# Patient Record
Sex: Male | Born: 1955 | Race: White | Hispanic: No | Marital: Married | Smoking: Never smoker
Health system: Southern US, Community
[De-identification: ages and names within clinical notes are randomized; demographics above are authoritative.]

## PROBLEM LIST (undated history)

## (undated) DIAGNOSIS — E119 Type 2 diabetes mellitus without complications: Secondary | ICD-10-CM

## (undated) DIAGNOSIS — E7800 Pure hypercholesterolemia, unspecified: Secondary | ICD-10-CM

## (undated) DIAGNOSIS — E039 Hypothyroidism, unspecified: Secondary | ICD-10-CM

## (undated) DIAGNOSIS — I10 Essential (primary) hypertension: Secondary | ICD-10-CM

## (undated) HISTORY — DX: Type 2 diabetes mellitus without complications: E11.9

## (undated) HISTORY — PX: NECK SURGERY: SHX720

## (undated) HISTORY — DX: Hypothyroidism, unspecified: E03.9

## (undated) HISTORY — PX: HERNIA REPAIR: SHX51

---

## 2000-12-31 ENCOUNTER — Encounter: Payer: Self-pay | Admitting: Unknown Physician Specialty

## 2000-12-31 ENCOUNTER — Ambulatory Visit (HOSPITAL_COMMUNITY): Admission: RE | Admit: 2000-12-31 | Discharge: 2000-12-31 | Payer: Self-pay | Admitting: Unknown Physician Specialty

## 2001-02-04 ENCOUNTER — Observation Stay (HOSPITAL_COMMUNITY): Admission: RE | Admit: 2001-02-04 | Discharge: 2001-02-05 | Payer: Self-pay | Admitting: Neurosurgery

## 2001-03-05 ENCOUNTER — Encounter: Admission: RE | Admit: 2001-03-05 | Discharge: 2001-03-05 | Payer: Self-pay | Admitting: Neurosurgery

## 2001-05-07 ENCOUNTER — Encounter: Admission: RE | Admit: 2001-05-07 | Discharge: 2001-05-07 | Payer: Self-pay | Admitting: Neurosurgery

## 2005-01-25 ENCOUNTER — Ambulatory Visit (HOSPITAL_COMMUNITY): Admission: RE | Admit: 2005-01-25 | Discharge: 2005-01-25 | Payer: Self-pay | Admitting: Family Medicine

## 2008-05-11 ENCOUNTER — Ambulatory Visit (HOSPITAL_COMMUNITY): Admission: RE | Admit: 2008-05-11 | Discharge: 2008-05-11 | Payer: Self-pay | Admitting: Family Medicine

## 2011-03-10 NOTE — H&P (Signed)
[place]. [facility]  Patient:    Eduardo Dunn, Eduardo Dunn                       MRN: [id number] Adm. Date:  02/04/01 Attending:  Cristi Loron, M.D.                         History and Physical  CHIEF COMPLAINT:  Left arm pain.  HISTORY OF PRESENT ILLNESS:  The patient is a 55 year old white male who has had approximately a six-week history of severe neck and left arm pain.  He had failed medical management and has been worked up as an outpatient with a cervical MRI that demonstrated a large herniated disk at C6-7 on the left. The patient therefore weighed the risks, benefits and alternatives to surgery and decided to proceed with an anterior cervical diskectomy and fusion.  PAST MEDICAL HISTORY:  Past medical history is positive for umbilical hernia.  PAST SURGICAL HISTORY:  Umbilical herniorrhaphy, age 22; toe surgery, age 17.  MEDICATIONS PRIOR TO ADMISSION:  P.r.n. Ultram, Naprosyn, Tylenol No. 3.  ALLERGIES:  Patient has no known drug allergies.  FAMILY MEDICAL HISTORY:  The patients mother is age 22, in good health.  The patients father died at age 64 of a myocardial infarction.  SOCIAL HISTORY:  The patient is married.  He has three children.  He lives in [place].  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  He denies tobacco or drug use.  He occasionally drinks alcohol.  REVIEW OF SYSTEMS:  Review of systems is negative except as above.  PHYSICAL EXAMINATION:  GENERAL:  A well-nourished, well-developed, pleasant 55 year old white male complaining of left arm pain.  Height 5 feet 7 inches.  Weight 195 pounds.  HEENT:  Normal.  NECK:  Neck is supple.  There are no masses, deformity, tracheal deviation, jugular venous distention or carotid bruits.  He has a limited cervical range of motion.  Spurlings test is positive on the left and negative on the right. Lhermittes sign was not present.  CHEST:  Thorax is symmetric.  Lungs are clear to  auscultation.  HEART:  Regular rate and rhythm.  ABDOMEN:  Abdomen soft, nontender.  EXTREMITIES:  No obvious deformities.  BACK:  He has no point tenderness or deformities.  NEUROLOGIC:  The patient is alert and oriented x 3.  Cranial nerves II-XII are grossly intact bilaterally.  Vision and hearing are grossly normal bilaterally.  Motor strength is 5/5 in bilateral deltoids, biceps, wrist extensors, interossei, hand grips, psoas, quadriceps, gastrocnemii, extensor hallucis longi and right triceps; his left triceps strength is diminished at 4/5.  Cerebellar exam is intact to rapid alternating movements of the upper extremities bilaterally.  Sensory exam demonstrates decreased light touch sensation in the left C7 distribution, otherwise, unremarkable.  Deep tendon reflexes are 2/4 in the bilateral biceps, brachioradialis, quadriceps, gastrocnemii and right triceps; 1/4 in left triceps.  DIAGNOSTIC STUDY:  The patient had a cervical MRI performed at [facility], December 31, 2000, which demonstrates patient has a straight cervical spine.  At C5-6, he has a central and left-sided bulging.  He has some spondylosis but no significant neural compression.  At C6-7, he has a large left-sided herniated nucleus pulposus compressing the left C7 nerve root. C7-T1 is normal.  ASSESSMENT AND PLAN:  C6-7 herniated nucleus pulposus, degenerative disk disease, spinal stenosis, cervicalgia, cervical radiculopathy.  I discussed the situation  with patient and his wife, reviewed the MRI scan with them and pointed out the abnormalities.  The patients signs and symptoms and his physical exam are consistent with left C7 radiculopathy caused by disk herniation at C6-7.  I discussed the various treatment options including doing nothing, continued medical management and surgery.  I discussed both anterior and posterior surgery.  I described at C6-7 anterior cervical diskectomy, interbody iliac crest  allograft arthrodesis and anterior cervical plating.  I have shown him surgical models and discussed the risks of surgery extensively. The patient has weighed the risks, benefits and alternatives to surgery and wants to proceed with a C6-7 anterior cervical diskectomy, fusion and plating on February 04, 2001. DD:  02/04/01 TD:  02/04/01 Job: [id number]

## 2011-03-10 NOTE — Op Note (Signed)
[place]. [facility]  Patient:    Eduardo Dunn, Eduardo Dunn                          MRN: [id number] Proc. Date: 02/04/01 Adm. Date:  02/04/01 Attending:  Cristi Loron, M.D.                           Operative Report  PREOPERATIVE DIAGNOSIS:  C6-7 herniated nucleus pulposus, degenerative disk disease, spinal stenosis, cervicalgia, cervical radiculopathy.  POSTOPERATIVE DIAGNOSIS:  C6-7 herniated nucleus pulposus, degenerative disk disease, spinal stenosis, cervicalgia, cervical radiculopathy.  PROCEDURES: 1. C6-7 extensive anterior cervical diskectomy. 2. Interbody iliac crest allograft arthrodesis. 3. Anterior cervical plating (Codman titanium plate and screws).  SURGEON:  Cristi Loron, M.D.  ASSISTANT:  Hewitt Shorts, M.D.  ANESTHESIA:  General endotracheal.  ESTIMATED BLOOD LOSS:  100 cc.  SPECIMENS:  None.  DRAINS:  None.  COMPLICATIONS:  None.  BRIEF HISTORY:  The patient is a 55 year old white male who has suffered from neck and severe left arm pain.  He failed medical management and was worked up as an outpatient with a cervical MRI that demonstrated a large herniated disk at C6-7 on the left.  The patients signs and symptoms and physical exam were consistent with a left C7 radiculopathy.  He had failed medical management and therefore weighed the risks, benefits, and alternatives of surgery and decided to proceed with the anterior cervical diskectomy, fusion and plating.  DESCRIPTION OF PROCEDURE:  The patient was brought to the operating room by the anesthesia team.  General endotracheal anesthesia was induced.  The patient remained in a supine position.  A roll was placed under his shoulders to place his neck in slight extension.  His anterior cervical region was then prepared with Betadine scrub and Betadine solution, and sterile drapes were applied.  I then injected the area to be incised with Marcaine with epinephrine  solution.  We used a scalpel to make a transverse incision in the patients left anterior neck.  I used the Metzenbaum scissors to divide the platysma muscle and then dissected medial to the sternocleidomastoid muscle, jugular vein, and carotid artery, and then bluntly dissected down to the anterior cervical spine.  I cleared the soft tissue with Kitner swabs and then inserted a bent spinal needle into the exposed interspace.  I obtained an intraoperative radiograph that demonstrated the needle was at C6-7.  I then used the electrocautery to detach the medial border of the longus colli muscle from the bilateral intervertebral disk space at C6-7.  I inserted a Caspar self-retaining retractor for exposure, and then I used a 15 blade scalpel to incise the C6-7 intervertebral disk.  I performed a partial diskectomy with the pituitary forceps and Karlin curette.  I then inserted distraction screws at C6-7 and distracted the interspace and then used the high-speed drill to decorticate the vertebral end plates at V4-0 and drill away the remainder of the intervertebral disk.  I thinned out the posterior longitudinal ligament with the drill and incised it with the arachnoid knife and removed it with the Kerrison punch, undercutting the vertebral end plates at J8-1, decompressing the thecal sac.  Of note, there was a large left-sided herniated nucleus pulposus compressing the left C7 nerve root.  I removed it in several fragments using the pituitary forceps and then performed a foraminotomy about the bilateral C7 nerve root.  Having  completed the anterior cervical diskectomy, I now turned my attention to the arthrodesis.  I obtained iliac crest tricortical allograft and fashioned it to these approximate dimensions:  7 mm in height, 1 cm in depth. I inserted the graft into the distracted interspace, removed the distraction screws.  There was a good, snug fit of the bone graft.  Having completed  arthrodesis, I now turned my attention to the anterior spinal instrumentation.  I obtained the appropriate-length Codman anterior cervical plate, laying it along the anterior aspect of the vertebral bodies of C6-7, put two holes at C6, two at C7, tapped these holes, and secured the plate to the vertebral bodies with 15 mm screws.  I then obtained the intraoperative radiograph that demonstrated good position of the plate, screws, and interbody graft.  I then secured the screws to the plate with the cam tightener.  I then copiously irrigated the wound with bacitracin solution, removed the solution, and then achieved stringent hemostasis using bipolar electrocautery.  I then inspected the esophagus for any damage, there was none, and then reapproximated the patients platysma muscle with interrupted 3-0 Vicryl sutures, the subcutaneous tissue with interrupted 3-0 Vicryl suture, and the skin with Steri-Strips and benzoin.  The wound was then coated with bacitracin ointment and sterile dressings were applied.  The drapes were removed, and the patient was subsequently extubated by the anesthesia team and transported to the postanesthesia care unit in stable condition.  All sponge, instrument, and needle counts were correct at the end of this case. DD:  02/04/01 TD:  02/05/01 Job: [id number]

## 2012-04-03 ENCOUNTER — Telehealth: Payer: Self-pay

## 2012-04-03 NOTE — Telephone Encounter (Signed)
Called pt. Phone number not working. Called work number. ( Eduardo Dunn's / Eduardo Dunn ) They said he has gone home for the day, I can call another day.

## 2012-04-04 ENCOUNTER — Other Ambulatory Visit: Payer: Self-pay

## 2012-04-04 DIAGNOSIS — Z139 Encounter for screening, unspecified: Secondary | ICD-10-CM

## 2012-04-04 NOTE — Telephone Encounter (Signed)
Gastroenterology Pre-Procedure Form    Request Date: 04/04/2012   Requesting Physician: Dr. Regino Schultze     PATIENT INFORMATION:  Eduardo Dunn is a 56 y.o., male (DOB=Apr 18, 1956).  PROCEDURE: Procedure(s) requested: colonoscopy Procedure Reason: screening for colon cancer  PATIENT REVIEW QUESTIONS: The patient reports the following:   1. Diabetes Melitis: no 2. Joint replacements in the past 12 months: no 3. Major health problems in the past 3 months: no 4. Has an artificial valve or MVP:no 5. Has been advised in past to take antibiotics in advance of a procedure like teeth cleaning: no}    MEDICATIONS & ALLERGIES:    Patient reports the following regarding taking any blood thinners:   Plavix? no Aspirin?no Coumadin?  no  Patient confirms/reports the following medications:  Current Outpatient Prescriptions  Medication Sig Dispense Refill  . lisinopril (PRINIVIL,ZESTRIL) 40 MG tablet Take 40 mg by mouth daily.      . simvastatin (ZOCOR) 20 MG tablet Take 20 mg by mouth every evening.        Patient confirms/reports the following allergies:  No Known Allergies  Patient is appropriate to schedule for requested procedure(s): yes  AUTHORIZATION INFORMATION Primary Insurance:   ID #: Group #:  Pre-Cert / Auth required:  Pre-Cert / Auth #:   Secondary Insurance:   ID #:   Group #:  Pre-Cert / Auth required: Pre-Cert / Auth #:   No orders of the defined types were placed in this encounter.    SCHEDULE INFORMATION: Procedure has been scheduled as follows:  Date: 04/23/2012    Time: 11:00 AM  Location: [facility] short Stay  This Gastroenterology Pre-Precedure Form is being routed to the following provider(s) for review: R. Roetta Sessions, MD

## 2012-04-04 NOTE — Telephone Encounter (Signed)
OK to proceed with colonoscopy.

## 2012-04-04 NOTE — Telephone Encounter (Signed)
Rx and instructions mailed to pt.  

## 2012-04-22 MED ORDER — SODIUM CHLORIDE 0.45 % IV SOLN
Freq: Once | INTRAVENOUS | Status: AC
  Administered 2012-04-23: 10:00:00 via INTRAVENOUS

## 2012-04-23 ENCOUNTER — Ambulatory Visit (HOSPITAL_COMMUNITY)
Admission: RE | Admit: 2012-04-23 | Discharge: 2012-04-23 | Disposition: A | Discharge status: Home / Self Care | Payer: BC Managed Care – PPO | Source: Ambulatory Visit | Attending: Internal Medicine | Admitting: Internal Medicine

## 2012-04-23 ENCOUNTER — Encounter (HOSPITAL_COMMUNITY): Payer: Self-pay | Admitting: *Deleted

## 2012-04-23 ENCOUNTER — Encounter (HOSPITAL_COMMUNITY)
Admission: RE | Disposition: A | Discharge status: Home / Self Care | Payer: Self-pay | Source: Ambulatory Visit | Attending: Internal Medicine

## 2012-04-23 DIAGNOSIS — I10 Essential (primary) hypertension: Secondary | ICD-10-CM | POA: Insufficient documentation

## 2012-04-23 DIAGNOSIS — Z1211 Encounter for screening for malignant neoplasm of colon: Secondary | ICD-10-CM

## 2012-04-23 DIAGNOSIS — D126 Benign neoplasm of colon, unspecified: Secondary | ICD-10-CM | POA: Insufficient documentation

## 2012-04-23 DIAGNOSIS — Z79899 Other long term (current) drug therapy: Secondary | ICD-10-CM | POA: Insufficient documentation

## 2012-04-23 DIAGNOSIS — Z139 Encounter for screening, unspecified: Secondary | ICD-10-CM

## 2012-04-23 DIAGNOSIS — K5730 Diverticulosis of large intestine without perforation or abscess without bleeding: Secondary | ICD-10-CM

## 2012-04-23 DIAGNOSIS — E7800 Pure hypercholesterolemia, unspecified: Secondary | ICD-10-CM | POA: Insufficient documentation

## 2012-04-23 HISTORY — DX: Essential (primary) hypertension: I10

## 2012-04-23 HISTORY — DX: Pure hypercholesterolemia, unspecified: E78.00

## 2012-04-23 HISTORY — PX: COLONOSCOPY: SHX5424

## 2012-04-23 SURGERY — COLONOSCOPY
Anesthesia: Moderate Sedation

## 2012-04-23 MED ORDER — STERILE WATER FOR IRRIGATION IR SOLN
Status: DC | PRN
  Administered 2012-04-23: 11:00:00

## 2012-04-23 MED ORDER — MIDAZOLAM HCL 5 MG/5ML IJ SOLN
INTRAMUSCULAR | Status: DC | PRN
  Administered 2012-04-23: 2 mg via INTRAVENOUS
  Administered 2012-04-23: 1 mg via INTRAVENOUS
  Administered 2012-04-23: 2 mg via INTRAVENOUS

## 2012-04-23 MED ORDER — MEPERIDINE HCL 100 MG/ML IJ SOLN
INTRAMUSCULAR | Status: DC | PRN
  Administered 2012-04-23: 50 mg via INTRAVENOUS
  Administered 2012-04-23 (×2): 25 mg via INTRAVENOUS

## 2012-04-23 MED ORDER — MEPERIDINE HCL 100 MG/ML IJ SOLN
INTRAMUSCULAR | Status: AC
  Filled 2012-04-23: qty 1

## 2012-04-23 MED ORDER — MIDAZOLAM HCL 5 MG/5ML IJ SOLN
INTRAMUSCULAR | Status: AC
  Filled 2012-04-23: qty 10

## 2012-04-23 NOTE — H&P (Signed)
  Primary Care Physician:  Kirk Ruths, MD Primary Gastroenterologist:  Dr. Jena Gauss  Pre-Procedure History & Physical: HPI:  Eduardo Dunn is a 56 y.o. male is here for a screening colonoscopy. No prior colonoscopy. No bowel symptoms. No Family history of colon polyps or colon Dunn.  Past Medical History  Diagnosis Date  . Hypertension   . Hypercholesteremia     Past Surgical History  Procedure Date  . Hernia repair     at age 79  . Neck surgery     Prior to Admission medications   Medication Sig Start Date End Date Taking? Authorizing Provider  lisinopril (PRINIVIL,ZESTRIL) 40 MG tablet Take 40 mg by mouth daily.   Yes Historical Provider, MD  simvastatin (ZOCOR) 20 MG tablet Take 20 mg by mouth every evening.    Historical Provider, MD    Allergies as of 04/04/2012  . (No Known Allergies)    Family History  Problem Relation Age of Onset  . Colon Dunn Neg Hx     History   Social History  . Marital Status: Married    Spouse Name: N/A    Number of Children: N/A  . Years of Education: N/A   Occupational History  . Not on file.   Social History Main Topics  . Smoking status: Never Smoker   . Smokeless tobacco: Not on file  . Alcohol Use: No  . Drug Use: No  . Sexually Active:    Other Topics Concern  . Not on file   Social History Narrative  . No narrative on file    Review of Systems: See HPI, otherwise negative ROS  Physical Exam: BP 140/90  Pulse 68  Temp 98.7 F (37.1 C) (Oral)  Resp 18  Ht 5' 7.5" (1.715 m)  Wt 208 lb (94.348 kg)  BMI 32.10 kg/m2  SpO2 96% General:   Alert,  Well-developed, well-nourished, pleasant and cooperative in NAD Head:  Normocephalic and atraumatic. Eyes:  Sclera clear, no icterus.   Conjunctiva pink. Ears:  Normal auditory acuity. Nose:  No deformity, discharge,  or lesions. Mouth:  No deformity or lesions, dentition normal. Neck:  Supple; no masses or thyromegaly. Lungs:  Clear throughout to  auscultation.   No wheezes, crackles, or rhonchi. No acute distress. Heart:  Regular rate and rhythm; no murmurs, clicks, rubs,  or gallops. Abdomen:  Soft, nontender and nondistended. No masses, hepatosplenomegaly or hernias noted. Normal bowel sounds, without guarding, and without rebound.   Msk:  Symmetrical without gross deformities. Normal posture. Pulses:  Normal pulses noted. Extremities:  Without clubbing or edema. Neurologic:  Alert and  oriented x4;  grossly normal neurologically. Skin:  Intact without significant lesions or rashes. Cervical Nodes:  No significant cervical adenopathy. Psych:  Alert and cooperative. Normal mood and affect.  Impression/Plan: Eduardo Dunn is now here to undergo a screening colonoscopy. First-ever average risk screening examination.  Risks, benefits, limitations, imponderables and alternatives regarding colonoscopy have been reviewed with the patient. Questions have been answered. All parties agreeable.

## 2012-04-23 NOTE — Discharge Instructions (Signed)
Colonoscopy Discharge Instructions  Read the instructions outlined below and refer to this sheet in the next few weeks. These discharge instructions provide you with general information on caring for yourself after you leave the hospital. Your doctor may also give you specific instructions. While your treatment has been planned according to the most current medical practices available, unavoidable complications occasionally occur. If you have any problems or questions after discharge, call Dr. Jena Gauss at [phone]. ACTIVITY  You may resume your regular activity, but move at a slower pace for the next 24 hours.   Take frequent rest periods for the next 24 hours.   Walking will help get rid of the air and reduce the bloated feeling in your belly (abdomen).   No driving for 24 hours (because of the medicine (anesthesia) used during the test).    Do not sign any important legal documents or operate any machinery for 24 hours (because of the anesthesia used during the test).  NUTRITION  Drink plenty of fluids.   You may resume your normal diet as instructed by your doctor.   Begin with a light meal and progress to your normal diet. Heavy or fried foods are harder to digest and may make you feel sick to your stomach (nauseated).   Avoid alcoholic beverages for 24 hours or as instructed.  MEDICATIONS  You may resume your normal medications unless your doctor tells you otherwise.  WHAT YOU CAN EXPECT TODAY  Some feelings of bloating in the abdomen.   Passage of more gas than usual.   Spotting of blood in your stool or on the toilet paper.  IF YOU HAD POLYPS REMOVED DURING THE COLONOSCOPY:  No aspirin products for 7 days or as instructed.   No alcohol for 7 days or as instructed.   Eat a soft diet for the next 24 hours.  FINDING OUT THE RESULTS OF YOUR TEST Not all test results are available during your visit. If your test results are not back during the visit, make an appointment  with your caregiver to find out the results. Do not assume everything is normal if you have not heard from your caregiver or the medical facility. It is important for you to follow up on all of your test results.  SEEK IMMEDIATE MEDICAL ATTENTION IF:  You have more than a spotting of blood in your stool.   Your belly is swollen (abdominal distention).   You are nauseated or vomiting.   You have a temperature over 101.   You have abdominal pain or discomfort that is severe or gets worse throughout the day.   Diverticulosis and polyp information provided.  Further recommendations to follow pending review of pathology report   Colon Polyps A polyp is extra tissue that grows inside your body. Colon polyps grow in the large intestine. The large intestine, also called the colon, is part of your digestive system. It is a long, hollow tube at the end of your digestive tract where your body makes and stores stool. Most polyps are not dangerous. They are benign. This means they are not cancerous. But over time, some types of polyps can turn into cancer. Polyps that are smaller than a pea are usually not harmful. But larger polyps could someday become or may already be cancerous. To be safe, doctors remove all polyps and test them.  WHO GETS POLYPS? Anyone can get polyps, but certain people are more likely than others. You may have a greater chance of getting polyps  if:  You are over 50.   You have had polyps before.   Someone in your family has had polyps.   Someone in your family has had cancer of the large intestine.   Find out if someone in your family has had polyps. You may also be more likely to get polyps if you:   Eat a lot of fatty foods.   Smoke.   Drink alcohol.   Do not exercise.   Eat too much.  SYMPTOMS  Most small polyps do not cause symptoms. People often do not know they have one until their caregiver finds it during a regular checkup or while testing them for  something else. Some people do have symptoms like these:  Bleeding from the anus. You might notice blood on your underwear or on toilet paper after you have had a bowel movement.   Constipation or diarrhea that lasts more than a week.   Blood in the stool. Blood can make stool look black or it can show up as red streaks in the stool.  If you have any of these symptoms, see your caregiver. HOW DOES THE DOCTOR TEST FOR POLYPS? The doctor can use four tests to check for polyps:  Digital rectal exam. The caregiver wears gloves and checks your rectum (the last part of the large intestine) to see if it feels normal. This test would find polyps only in the rectum. Your caregiver may need to do one of the other tests listed below to find polyps higher up in the intestine.   Barium enema. The caregiver puts a liquid called barium into your rectum before taking x-rays of your large intestine. Barium makes your intestine look white in the pictures. Polyps are dark, so they are easy to see.   Sigmoidoscopy. With this test, the caregiver can see inside your large intestine. A thin flexible tube is placed into your rectum. The device is called a sigmoidoscope, which has a light and a tiny video camera in it. The caregiver uses the sigmoidoscope to look at the last third of your large intestine.   Colonoscopy. This test is like sigmoidoscopy, but the caregiver looks at all of the large intestine. It usually requires sedation. This is the most common method for finding and removing polyps.  TREATMENT   The caregiver will remove the polyp during sigmoidoscopy or colonoscopy. The polyp is then tested for cancer.   If you have had polyps, your caregiver may want you to get tested regularly in the future.  PREVENTION  There is not one sure way to prevent polyps. You might be able to lower your risk of getting them if you:  Eat more fruits and vegetables and less fatty food.   Do not smoke.   Avoid  alcohol.   Exercise every day.   Lose weight if you are overweight.   Eating more calcium and folate can also lower your risk of getting polyps. Some foods that are rich in calcium are milk, cheese, and broccoli. Some foods that are rich in folate are chickpeas, kidney beans, and spinach.   Aspirin might help prevent polyps. Studies are under way.  Document Released: 07/05/2004 Document Revised: 09/28/2011 Document Reviewed: 12/11/2007 [facility] Patient Information 2012 [place], [place].Diverticulosis Diverticulosis is a common condition that develops when small pouches (diverticula) form in the wall of the colon. The risk of diverticulosis increases with age. It happens more often in people who eat a low-fiber diet. Most individuals with diverticulosis have no symptoms.  Those individuals with symptoms usually experience abdominal pain, constipation, or loose stools (diarrhea). HOME CARE INSTRUCTIONS   Increase the amount of fiber in your diet as directed by your caregiver or dietician. This may reduce symptoms of diverticulosis.   Your caregiver may recommend taking a dietary fiber supplement.   Drink at least 6 to 8 glasses of water each day to prevent constipation.   Try not to strain when you have a bowel movement.   Your caregiver may recommend avoiding nuts and seeds to prevent complications, although this is still an uncertain benefit.   Only take over-the-counter or prescription medicines for pain, discomfort, or fever as directed by your caregiver.  FOODS WITH HIGH FIBER CONTENT INCLUDE:  Fruits. Apple, peach, pear, tangerine, raisins, prunes.   Vegetables. Brussels sprouts, asparagus, broccoli, cabbage, carrot, cauliflower, romaine lettuce, spinach, summer squash, tomato, winter squash, zucchini.   Starchy Vegetables. Baked beans, kidney beans, lima beans, split peas, lentils, potatoes (with skin).   Grains. Whole wheat bread, brown rice, bran flake cereal, plain oatmeal,  white rice, shredded wheat, bran muffins.  SEEK IMMEDIATE MEDICAL CARE IF:   You develop increasing pain or severe bloating.   You have an oral temperature above 102 F (38.9 C), not controlled by medicine.   You develop vomiting or bowel movements that are bloody or black.  Document Released: 07/06/2004 Document Revised: 09/28/2011 Document Reviewed: 03/09/2010 [facility] Patient Information 2012 [place], [place].

## 2012-04-23 NOTE — Op Note (Signed)
[facility] [address] [place], [place]  [address]  COLONOSCOPY PROCEDURE REPORT  PATIENT:  Eduardo Dunn, Eduardo Dunn  MR#:  [id number] BIRTHDATE:  1955/12/17, 55 yrs. old  GENDER:  male ENDOSCOPIST:  R. Roetta Sessions, MD FACP [facility] REF. BY:  Karleen Hampshire, M.D. PROCEDURE DATE:  04/23/2012 PROCEDURE:  Colonoscopy with biopsy INDICATIONS:  First-ever average risk screening colonoscopy  INFORMED CONSENT:  The risks, benefits, alternatives and imponderables including but not limited to bleeding, perforation as well as the possibility of a missed lesion have been reviewed. The potential for biopsy, lesion removal, etc. have also been discussed.  Questions have been answered.  All parties agreeable. Please see the history and physical in the medical record for more information.  MEDICATIONS:  Versed 5 mg IV 100 mg IV in divided doses.  DESCRIPTION OF PROCEDURE:  After a digital rectal exam was performed, the EC-[id number] ([id number]) colonoscope was advanced from the anus through the rectum and colon to the area of the cecum, ileocecal valve and appendiceal orifice.  The cecum was deeply intubated.  These structures were well-seen and photographed for the record.  From the level of the cecum and ileocecal valve, the scope was slowly and cautiously withdrawn.  The mucosal surfaces were carefully surveyed utilizing scope tip deflection to facilitate fold flattening as needed.  The scope was pulled down into the rectum where a thorough examination including retroflexion was performed. <<PROCEDUREIMAGES>>  FINDINGS: Suboptimal preparation. Normal rectum. Scattered small mouth pancolonic diverticula; a single descending colon polyp was seen; the remainder of the colonic mucosa appeared normal. The distal 10 cm of terminal ileal mucosa appeared normal.  THERAPEUTIC / DIAGNOSTIC MANEUVERS PERFORMED:  The descending colon polyp mentioned above was cold biopsied/removed  COMPLICATIONS:   None  CECAL WITHDRAWAL TIME:    10 minutes  IMPRESSION: Colonic diverticulosis. Colonic polyps removed as described above  RECOMMENDATIONS:  Followup on pathology.  ______________________________ R. Roetta Sessions, MD Caleen Essex  CC:  Karleen Hampshire, M.D.  n. eSIGNED:   R. Roetta Sessions at 04/23/2012 11:30 AM  Norva Riffle, [phone]

## 2012-04-27 ENCOUNTER — Encounter: Payer: Self-pay | Admitting: Internal Medicine

## 2012-04-29 ENCOUNTER — Encounter (HOSPITAL_COMMUNITY): Payer: Self-pay | Admitting: Internal Medicine

## 2017-03-29 ENCOUNTER — Other Ambulatory Visit (HOSPITAL_COMMUNITY): Payer: Self-pay | Admitting: Family Medicine

## 2017-03-29 DIAGNOSIS — R1032 Left lower quadrant pain: Secondary | ICD-10-CM

## 2017-04-13 ENCOUNTER — Ambulatory Visit (HOSPITAL_COMMUNITY)
Admission: RE | Admit: 2017-04-13 | Discharge: 2017-04-13 | Disposition: A | Discharge status: Home / Self Care | Payer: BLUE CROSS/BLUE SHIELD | Source: Ambulatory Visit | Attending: Family Medicine | Admitting: Family Medicine

## 2017-04-13 ENCOUNTER — Encounter (HOSPITAL_COMMUNITY): Payer: Self-pay

## 2019-07-02 ENCOUNTER — Other Ambulatory Visit: Payer: Self-pay | Admitting: Family Medicine

## 2019-07-02 ENCOUNTER — Other Ambulatory Visit (HOSPITAL_COMMUNITY): Payer: Self-pay | Admitting: Family Medicine

## 2019-07-02 DIAGNOSIS — R1031 Right lower quadrant pain: Secondary | ICD-10-CM

## 2019-07-02 DIAGNOSIS — R1032 Left lower quadrant pain: Secondary | ICD-10-CM

## 2019-07-15 ENCOUNTER — Other Ambulatory Visit (HOSPITAL_COMMUNITY): Payer: BLUE CROSS/BLUE SHIELD

## 2019-07-15 ENCOUNTER — Encounter (HOSPITAL_COMMUNITY): Payer: Self-pay

## 2019-10-04 ENCOUNTER — Emergency Department (INDEPENDENT_AMBULATORY_CARE_PROVIDER_SITE_OTHER)
Admission: EM | Admit: 2019-10-04 | Discharge: 2019-10-04 | Disposition: A | Discharge status: Home / Self Care | Payer: BLUE CROSS/BLUE SHIELD | Source: Home / Self Care

## 2019-10-04 ENCOUNTER — Other Ambulatory Visit: Payer: Self-pay

## 2019-10-04 DIAGNOSIS — R509 Fever, unspecified: Secondary | ICD-10-CM

## 2019-10-04 DIAGNOSIS — U071 COVID-19: Secondary | ICD-10-CM | POA: Diagnosis not present

## 2019-10-04 LAB — POC SARS CORONAVIRUS 2 AG -  ED: SARS Coronavirus 2 Ag: POSITIVE — AB

## 2019-10-04 NOTE — ED Triage Notes (Signed)
Here with temp 101.0 last night relieved by OTC Tylenol. Denies contact COVID exposure Wife asymptomatic

## 2019-10-05 NOTE — ED Provider Notes (Signed)
[facility]    CSN: [id number] Arrival date & time: 10/04/19  1058      History   Chief Complaint Chief Complaint  Patient presents with  . Fever    HPI [name] is a 63 y.o. male.   Patient reports he had a fever to 101 last night.  He is here requesting Covid testing.  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  The history is provided by the patient. No language interpreter was used.  Fever Ineffective treatments:  Acetaminophen Associated symptoms: chills     Past Medical History:  Diagnosis Date  . Hypercholesteremia   . Hypertension     There are no problems to display for this patient.   Past Surgical History:  Procedure Laterality Date  . COLONOSCOPY  04/23/2012   Procedure: COLONOSCOPY;  Surgeon: Daneil Dolin, MD;  Location: [facility];  Service: Endoscopy;  Laterality: N/A;  11:00 AM  . HERNIA REPAIR     at age 21  . NECK SURGERY         Home Medications    Prior to Admission medications   Medication Sig Start Date End Date Taking? Authorizing Provider  lisinopril (PRINIVIL,ZESTRIL) 40 MG tablet Take 40 mg by mouth daily.    [provider]  simvastatin (ZOCOR) 20 MG tablet Take 20 mg by mouth every evening.    [provider]    Family History Family History  Problem Relation Age of Onset  . Colon cancer Neg Hx     Social History Social History   Tobacco Use  . Smoking status: Never Smoker  Substance Use Topics  . Alcohol use: No  . Drug use: No     Allergies   Patient has no known allergies.   Review of Systems Review of Systems  Constitutional: Positive for chills and fever.  All other systems reviewed and are negative.    Physical Exam Triage Vital Signs ED Triage Vitals  Enc Vitals Group     BP 10/04/19 1152 (!) 141/81     Pulse Rate 10/04/19 1152 80     Resp --      Temp 10/04/19 1152 99.2 F (37.3 C)     Temp Source 10/04/19 1152 Oral   SpO2 10/04/19 1152 98 %     Weight 10/04/19 1153 239 lb (108.4 kg)     Height 10/04/19 1153 5\' 7"  (1.702 m)     Head Circumference --      Peak Flow --      Pain Score 10/04/19 1153 0     Pain Loc --      Pain Edu? --      Excl. in [place]? --    No data found.  Updated Vital Signs BP (!) 141/81 (BP Location: Right Arm)   Pulse 80   Temp 99.2 F (37.3 C) (Oral)   Ht 5\' 7"  (1.702 m)   Wt 108.4 kg   SpO2 98%   BMI 37.43 kg/m   Visual Acuity Right Eye Distance:   Left Eye Distance:   Bilateral Distance:    Right Eye Near:   Left Eye Near:    Bilateral Near:     Physical Exam Vitals and nursing note reviewed.  Constitutional:      Appearance: He is well-developed.  HENT:     Head: Normocephalic and atraumatic.  Eyes:     Conjunctiva/sclera: Conjunctivae normal.  Cardiovascular:  Rate and Rhythm: Normal rate and regular rhythm.     Heart sounds: No murmur.  Pulmonary:     Effort: Pulmonary effort is normal. No respiratory distress.     Breath sounds: Normal breath sounds.  Abdominal:     Tenderness: There is no abdominal tenderness.  Musculoskeletal:     Cervical back: Neck supple.  Skin:    General: Skin is warm and dry.  Neurological:     General: No focal deficit present.     Mental Status: He is alert.  Psychiatric:        Mood and Affect: Mood normal.      UC Treatments / Results  Labs (all labs ordered are listed, but only abnormal results are displayed) Labs Reviewed  POC SARS CORONAVIRUS 2 AG -  ED - Abnormal; Notable for the following components:      Result Value   SARS Coronavirus 2 Ag Positive (*)    All other components within normal limits    EKG   Radiology No results found.  Procedures Procedures (including critical care time)  Medications Ordered in UC Medications - No data to display  Initial Impression / Assessment and Plan / UC Course  I have reviewed the triage vital signs and the nursing notes.  Pertinent labs &  imaging results that were available during my care of the patient were reviewed by me and considered in my medical decision making (see chart for details).     MDM: Patient's Covid test is positive patient is counseled on quarantine he is also advised he should notify the people that he was around. Final Clinical Impressions(s) / UC Diagnoses   Final diagnoses:  [id number] virus detected   Discharge Instructions   None    ED Prescriptions    None     PDMP not reviewed this encounter.  An After Visit Summary was printed and given to the patient.   Fransico Meadow, [place] 10/05/19 [phone]

## 2019-10-13 ENCOUNTER — Other Ambulatory Visit: Payer: Self-pay

## 2019-10-13 ENCOUNTER — Inpatient Hospital Stay (HOSPITAL_COMMUNITY)
Admission: EM | Admit: 2019-10-13 | Discharge: 2019-10-21 | DRG: 177 | Disposition: A | Discharge status: Home / Self Care | Payer: BLUE CROSS/BLUE SHIELD | Attending: Internal Medicine | Admitting: Internal Medicine

## 2019-10-13 ENCOUNTER — Emergency Department (HOSPITAL_COMMUNITY): Payer: BLUE CROSS/BLUE SHIELD

## 2019-10-13 ENCOUNTER — Encounter (HOSPITAL_COMMUNITY): Payer: Self-pay

## 2019-10-13 DIAGNOSIS — J1289 Other viral pneumonia: Secondary | ICD-10-CM | POA: Diagnosis present

## 2019-10-13 DIAGNOSIS — J069 Acute upper respiratory infection, unspecified: Secondary | ICD-10-CM | POA: Diagnosis not present

## 2019-10-13 DIAGNOSIS — U071 COVID-19: Secondary | ICD-10-CM | POA: Diagnosis present

## 2019-10-13 DIAGNOSIS — E118 Type 2 diabetes mellitus with unspecified complications: Secondary | ICD-10-CM | POA: Diagnosis present

## 2019-10-13 DIAGNOSIS — Z79899 Other long term (current) drug therapy: Secondary | ICD-10-CM | POA: Diagnosis not present

## 2019-10-13 DIAGNOSIS — I10 Essential (primary) hypertension: Secondary | ICD-10-CM | POA: Diagnosis present

## 2019-10-13 DIAGNOSIS — Z23 Encounter for immunization: Secondary | ICD-10-CM | POA: Diagnosis not present

## 2019-10-13 DIAGNOSIS — J1282 Pneumonia due to coronavirus disease 2019: Secondary | ICD-10-CM | POA: Diagnosis present

## 2019-10-13 DIAGNOSIS — E1169 Type 2 diabetes mellitus with other specified complication: Secondary | ICD-10-CM | POA: Diagnosis present

## 2019-10-13 DIAGNOSIS — R7989 Other specified abnormal findings of blood chemistry: Secondary | ICD-10-CM | POA: Diagnosis not present

## 2019-10-13 DIAGNOSIS — R0602 Shortness of breath: Secondary | ICD-10-CM | POA: Diagnosis present

## 2019-10-13 DIAGNOSIS — E785 Hyperlipidemia, unspecified: Secondary | ICD-10-CM | POA: Diagnosis present

## 2019-10-13 DIAGNOSIS — R748 Abnormal levels of other serum enzymes: Secondary | ICD-10-CM | POA: Diagnosis present

## 2019-10-13 DIAGNOSIS — E7800 Pure hypercholesterolemia, unspecified: Secondary | ICD-10-CM | POA: Diagnosis present

## 2019-10-13 DIAGNOSIS — R0902 Hypoxemia: Secondary | ICD-10-CM

## 2019-10-13 DIAGNOSIS — J9601 Acute respiratory failure with hypoxia: Secondary | ICD-10-CM | POA: Diagnosis present

## 2019-10-13 DIAGNOSIS — E876 Hypokalemia: Secondary | ICD-10-CM | POA: Diagnosis present

## 2019-10-13 LAB — COMPREHENSIVE METABOLIC PANEL
ALT: 26 U/L (ref 0–44)
AST: 33 U/L (ref 15–41)
Albumin: 2.8 g/dL — ABNORMAL LOW (ref 3.5–5.0)
Alkaline Phosphatase: 68 U/L (ref 38–126)
Anion gap: 14 (ref 5–15)
BUN: 23 mg/dL (ref 8–23)
CO2: 21 mmol/L — ABNORMAL LOW (ref 22–32)
Calcium: 7.7 mg/dL — ABNORMAL LOW (ref 8.9–10.3)
Chloride: 100 mmol/L (ref 98–111)
Creatinine, Ser: 0.81 mg/dL (ref 0.61–1.24)
GFR calc Af Amer: >60 mL/min (ref >60)
GFR calc non Af Amer: >60 mL/min (ref >60)
Glucose, Bld: 137 mg/dL — ABNORMAL HIGH (ref 70–99)
Potassium: 3.2 mmol/L — ABNORMAL LOW (ref 3.5–5.1)
Sodium: 135 mmol/L (ref 135–145)
Total Bilirubin: 0.7 mg/dL (ref 0.3–1.2)
Total Protein: 6.9 g/dL (ref 6.5–8.1)

## 2019-10-13 LAB — FERRITIN: Ferritin: 339 ng/mL — ABNORMAL HIGH (ref 24–336)

## 2019-10-13 LAB — GLUCOSE, CAPILLARY
Glucose-Capillary: 227 mg/dL — ABNORMAL HIGH (ref 70–99)
Glucose-Capillary: 153 mg/dL — ABNORMAL HIGH (ref 70–99)

## 2019-10-13 LAB — CBC WITH DIFFERENTIAL/PLATELET
Abs Immature Granulocytes: 0.52 10*3/uL — ABNORMAL HIGH (ref 0.00–0.07)
Basophils Absolute: 0.1 10*3/uL (ref 0.0–0.1)
Basophils Relative: 0 %
Eosinophils Absolute: 0 10*3/uL (ref 0.0–0.5)
Eosinophils Relative: 0 %
HCT: 44.3 % (ref 39.0–52.0)
Hemoglobin: 14.3 g/dL (ref 13.0–17.0)
Immature Granulocytes: 3 %
Lymphocytes Relative: 11 %
Lymphs Abs: 1.9 10*3/uL (ref 0.7–4.0)
MCH: 29.7 pg (ref 26.0–34.0)
MCHC: 32.3 g/dL (ref 30.0–36.0)
MCV: 92.1 fL (ref 80.0–100.0)
Monocytes Absolute: 0.7 10*3/uL (ref 0.1–1.0)
Monocytes Relative: 4 %
Neutro Abs: 14.1 10*3/uL — ABNORMAL HIGH (ref 1.7–7.7)
Neutrophils Relative %: 82 %
Platelets: 425 10*3/uL — ABNORMAL HIGH (ref 150–400)
RBC: 4.81 MIL/uL (ref 4.22–5.81)
RDW: 13.5 % (ref 11.5–15.5)
WBC: 17.2 10*3/uL — ABNORMAL HIGH (ref 4.0–10.5)
nRBC: 0.1 % (ref 0.0–0.2)

## 2019-10-13 LAB — LACTIC ACID, PLASMA
Lactic Acid, Venous: 2.1 mmol/L (ref 0.5–1.9)
Lactic Acid, Venous: 1.3 mmol/L (ref 0.5–1.9)

## 2019-10-13 LAB — D-DIMER, QUANTITATIVE: D-Dimer, Quant: 1.56 ug/mL-FEU — ABNORMAL HIGH (ref 0.00–0.50)

## 2019-10-13 LAB — C-REACTIVE PROTEIN: CRP: 8.1 mg/dL — ABNORMAL HIGH (ref <1.0)

## 2019-10-13 LAB — HEMOGLOBIN A1C
Hgb A1c MFr Bld: 6.7 % — ABNORMAL HIGH (ref 4.8–5.6)
Mean Plasma Glucose: 145.59 mg/dL

## 2019-10-13 LAB — LACTATE DEHYDROGENASE: LDH: 367 U/L — ABNORMAL HIGH (ref 98–192)

## 2019-10-13 LAB — FIBRINOGEN: Fibrinogen: >800 mg/dL — ABNORMAL HIGH (ref 210–475)

## 2019-10-13 LAB — TRIGLYCERIDES: Triglycerides: 185 mg/dL — ABNORMAL HIGH (ref <150)

## 2019-10-13 LAB — PROCALCITONIN: Procalcitonin: <0.1 ng/mL

## 2019-10-13 LAB — MAGNESIUM: Magnesium: 2.5 mg/dL — ABNORMAL HIGH (ref 1.7–2.4)

## 2019-10-13 MED ORDER — INSULIN ASPART 100 UNIT/ML ~~LOC~~ SOLN: 0.0000 [IU] | Freq: Every day | SUBCUTANEOUS | Status: DC

## 2019-10-13 MED ORDER — PANTOPRAZOLE SODIUM 40 MG PO TBEC
40.0000 mg | DELAYED_RELEASE_TABLET | Freq: Every day | ORAL | Status: DC
  Administered 2019-10-13 – 2019-10-21 (×9): 40 mg via ORAL
  Filled 2019-10-13 (×10): qty 1

## 2019-10-13 MED ORDER — INSULIN DETEMIR 100 UNIT/ML ~~LOC~~ SOLN
0.0750 [IU]/kg | Freq: Two times a day (BID) | SUBCUTANEOUS | Status: DC
  Administered 2019-10-13 – 2019-10-20 (×15): 8 [IU] via SUBCUTANEOUS
  Filled 2019-10-13 (×17): qty 0.08

## 2019-10-13 MED ORDER — POTASSIUM CHLORIDE CRYS ER 20 MEQ PO TBCR
40.0000 meq | EXTENDED_RELEASE_TABLET | Freq: Once | ORAL | Status: AC
  Administered 2019-10-13: 40 meq via ORAL
  Filled 2019-10-13: qty 2

## 2019-10-13 MED ORDER — ENOXAPARIN SODIUM 40 MG/0.4ML ~~LOC~~ SOLN
40.0000 mg | Freq: Two times a day (BID) | SUBCUTANEOUS | Status: DC
  Administered 2019-10-13 – 2019-10-15 (×4): 40 mg via SUBCUTANEOUS
  Filled 2019-10-13 (×5): qty 0.4

## 2019-10-13 MED ORDER — SODIUM CHLORIDE 0.9 % IV SOLN
200.0000 mg | Freq: Once | INTRAVENOUS | Status: AC
  Administered 2019-10-13: 12:00:00 200 mg via INTRAVENOUS
  Filled 2019-10-13: qty 40

## 2019-10-13 MED ORDER — INSULIN ASPART 100 UNIT/ML ~~LOC~~ SOLN
0.0000 [IU] | Freq: Three times a day (TID) | SUBCUTANEOUS | Status: DC
  Administered 2019-10-13: 19:00:00 3 [IU] via SUBCUTANEOUS
  Administered 2019-10-14: 2 [IU] via SUBCUTANEOUS
  Administered 2019-10-14 – 2019-10-15 (×3): 1 [IU] via SUBCUTANEOUS
  Administered 2019-10-15 (×2): 2 [IU] via SUBCUTANEOUS
  Administered 2019-10-16: 3 [IU] via SUBCUTANEOUS
  Administered 2019-10-16: 2 [IU] via SUBCUTANEOUS
  Administered 2019-10-16 – 2019-10-17 (×3): 1 [IU] via SUBCUTANEOUS
  Administered 2019-10-17 – 2019-10-18 (×2): 3 [IU] via SUBCUTANEOUS
  Administered 2019-10-18 – 2019-10-19 (×3): 2 [IU] via SUBCUTANEOUS
  Administered 2019-10-20: 18:00:00 3 [IU] via SUBCUTANEOUS

## 2019-10-13 MED ORDER — METHYLPREDNISOLONE SODIUM SUCC 125 MG IJ SOLR
50.0000 mg | Freq: Two times a day (BID) | INTRAMUSCULAR | Status: DC
  Administered 2019-10-13 – 2019-10-20 (×14): 50 mg via INTRAVENOUS
  Filled 2019-10-13 (×14): qty 2

## 2019-10-13 MED ORDER — SODIUM CHLORIDE 0.9 % IV SOLN
100.0000 mg | Freq: Every day | INTRAVENOUS | Status: DC
  Administered 2019-10-14 – 2019-10-15 (×2): 100 mg via INTRAVENOUS
  Filled 2019-10-13 (×3): qty 20

## 2019-10-13 MED ORDER — TOCILIZUMAB 400 MG/20ML IV SOLN
800.0000 mg | Freq: Once | INTRAVENOUS | Status: DC
  Filled 2019-10-13: qty 40

## 2019-10-13 MED ORDER — TOCILIZUMAB 400 MG/20ML IV SOLN
800.0000 mg | Freq: Once | INTRAVENOUS | Status: AC
  Administered 2019-10-13: 23:00:00 800 mg via INTRAVENOUS
  Filled 2019-10-13: qty 40

## 2019-10-13 MED ORDER — DEXAMETHASONE SODIUM PHOSPHATE 10 MG/ML IJ SOLN
10.0000 mg | Freq: Once | INTRAMUSCULAR | Status: AC
  Administered 2019-10-13: 10 mg via INTRAVENOUS
  Filled 2019-10-13: qty 1

## 2019-10-13 MED ORDER — POTASSIUM CHLORIDE CRYS ER 20 MEQ PO TBCR
20.0000 meq | EXTENDED_RELEASE_TABLET | Freq: Once | ORAL | Status: AC
  Administered 2019-10-13: 12:00:00 20 meq via ORAL
  Filled 2019-10-13: qty 1

## 2019-10-13 NOTE — Progress Notes (Addendum)
Seen on arrival to [facility] from [facility]. He appears comfortable on nonrebreather.  Notes breath at the moment feels "ok".  Vitals:   10/13/19 1415 10/13/19 1430  BP:  122/76  Pulse: 74 77  Resp: (!) 30 (!) 30  Temp:    SpO2: 93% (!) 88%   COVID-19 Labs  Recent Labs    10/13/19 0911  DDIMER 1.56*  FERRITIN 339*  LDH 367*  CRP 8.1*    No results found for: SARSCOV2NAA  Continue steroids and remedsivir. Intermediate dose lovenox given critical illness. Actemra ordered at [facility].  Not given yet.  Give when able.  Dr. Carles Collet discussed risks/benefits and I also reviewed risks/benefits, lack of clear evidence, off label use.  Denies hx TB or hepatitis. Discussed plasma risks/benefits.  He's going to review consent form and let me know if he has additional questions.  Will give plasma if he's agreeable as well.  Addendum: pt seems like he'd like to hold off on plasma for now.   May need heated high flow depending on course, will follow closely.

## 2019-10-13 NOTE — Plan of Care (Signed)
  Problem: Education: Goal: Knowledge of risk factors and measures for prevention of condition will improve 10/13/2019 2046 by Raelyn Ensign, RN Outcome: Progressing 10/13/2019 2046 by Raelyn Ensign, RN Outcome: Progressing

## 2019-10-13 NOTE — Plan of Care (Signed)
  Problem: Respiratory: Goal: Will maintain a patent airway Outcome: Progressing Goal: Complications related to the disease process, condition or treatment will be avoided or minimized Outcome: Progressing   

## 2019-10-13 NOTE — ED Provider Notes (Signed)
[facility] EMERGENCY DEPARTMENT Provider Note   CSN: [id number] Arrival date & time: 10/13/19  [id number]     History Chief Complaint  Patient presents with  . Shortness of Breath    Eduardo Dunn is a 63 y.o. male with a history of hypertension hypercholesterolemia who was seen here on December 12 and tested positive for Covid after he had developed fever the day before.  He presents today for increased shortness of breath over the past 3 days.  Prior to arrival his O2 sats were 73% per EMS.  He does continue to have low-grade fevers up to 100 degrees.  He last took Tylenol last night for fever reduction.  He reports no appetite, has had almost no intake of solid food in the past several days but has been attempting to maintain hydration.  He denies abdominal pain, no nausea, vomiting or diarrhea.  Cough has been nonproductive, he denies wheezing.  He has had no other treatments prior to arrival.  HPI     Past Medical History:  Diagnosis Date  . Hypercholesteremia   . Hypertension     There are no problems to display for this patient.   Past Surgical History:  Procedure Laterality Date  . COLONOSCOPY  04/23/2012   Procedure: COLONOSCOPY;  Surgeon: Daneil Dolin, MD;  Location: [facility];  Service: Endoscopy;  Laterality: N/A;  11:00 AM  . HERNIA REPAIR     at age 49  . NECK SURGERY         Family History  Problem Relation Age of Onset  . Colon cancer Neg Hx     Social History   Tobacco Use  . Smoking status: Never Smoker  . Smokeless tobacco: Never Used  Substance Use Topics  . Alcohol use: No  . Drug use: No    Home Medications Prior to Admission medications   Medication Sig Start Date End Date Taking? Authorizing Provider  lisinopril (PRINIVIL,ZESTRIL) 40 MG tablet Take 40 mg by mouth daily.    [provider]  simvastatin (ZOCOR) 20 MG tablet Take 20 mg by mouth every evening.    [provider]    Allergies    Patient has no known  allergies.  Review of Systems   Review of Systems  Constitutional: Positive for fatigue and fever.  HENT: Negative for congestion and sore throat.   Eyes: Negative.   Respiratory: Positive for cough and shortness of breath. Negative for chest tightness.   Cardiovascular: Negative for chest pain.  Gastrointestinal: Negative for abdominal pain, diarrhea, nausea and vomiting.  Genitourinary: Negative.   Musculoskeletal: Negative for arthralgias, joint swelling and neck pain.  Skin: Negative.  Negative for rash and wound.  Neurological: Negative for dizziness, light-headedness, numbness and headaches.  Psychiatric/Behavioral: Negative.     Physical Exam Updated Vital Signs BP 121/70 (BP Location: Right Arm)   Pulse 90   Temp 98.2 F (36.8 C)   Resp (!) 29   Ht 5\' 7"  (1.702 m)   Wt 106.6 kg   SpO2 90%   BMI 36.81 kg/m   Physical Exam Vitals and nursing note reviewed.  Constitutional:      Appearance: He is well-developed.  HENT:     Head: Normocephalic and atraumatic.  Eyes:     Conjunctiva/sclera: Conjunctivae normal.  Cardiovascular:     Rate and Rhythm: Normal rate and regular rhythm.     Heart sounds: Normal heart sounds.  Pulmonary:     Effort: Pulmonary effort  is normal.     Breath sounds: Decreased breath sounds present. No wheezing or rhonchi.     Comments: Oxygen 92-94% on non rebreather. Abdominal:     General: Bowel sounds are normal.     Palpations: Abdomen is soft. There is no mass.     Tenderness: There is no abdominal tenderness. There is no guarding.  Musculoskeletal:        General: Normal range of motion.     Cervical back: Normal range of motion.     Right lower leg: No edema.     Left lower leg: No edema.  Skin:    General: Skin is warm and dry.  Neurological:     Mental Status: He is alert.     ED Results / Procedures / Treatments   Labs (all labs ordered are listed, but only abnormal results are displayed) Labs Reviewed  LACTIC ACID,  PLASMA - Abnormal; Notable for the following components:      Result Value   Lactic Acid, Venous 2.1 (*)    All other components within normal limits  CBC WITH DIFFERENTIAL/PLATELET - Abnormal; Notable for the following components:   WBC 17.2 (*)    Platelets 425 (*)    Neutro Abs 14.1 (*)    Abs Immature Granulocytes 0.52 (*)    All other components within normal limits  COMPREHENSIVE METABOLIC PANEL - Abnormal; Notable for the following components:   Potassium 3.2 (*)    CO2 21 (*)    Glucose, Bld 137 (*)    Calcium 7.7 (*)    Albumin 2.8 (*)    All other components within normal limits  D-DIMER, QUANTITATIVE (NOT AT [facility]) - Abnormal; Notable for the following components:   D-Dimer, Quant 1.56 (*)    All other components within normal limits  LACTATE DEHYDROGENASE - Abnormal; Notable for the following components:   LDH 367 (*)    All other components within normal limits  FERRITIN - Abnormal; Notable for the following components:   Ferritin 339 (*)    All other components within normal limits  TRIGLYCERIDES - Abnormal; Notable for the following components:   Triglycerides 185 (*)    All other components within normal limits  FIBRINOGEN - Abnormal; Notable for the following components:   Fibrinogen >800 (*)    All other components within normal limits  C-REACTIVE PROTEIN - Abnormal; Notable for the following components:   CRP 8.1 (*)    All other components within normal limits  CULTURE, BLOOD (ROUTINE X 2)  CULTURE, BLOOD (ROUTINE X 2)  PROCALCITONIN  LACTIC ACID, PLASMA    EKG EKG Interpretation  Date/Time:  Monday October 13 2019 08:55:08 EST Ventricular Rate:  86 PR Interval:    QRS Duration: 89 QT Interval:  394 QTC Calculation: 472 R Axis:   17 Text Interpretation: Sinus rhythm Borderline repolarization abnormality No STEMI Confirmed by Nanda Quinton [phone]) on 10/13/2019 9:08:52 AM   Radiology DG Chest Portable 1 View  Result Date: 10/13/2019 CLINICAL  DATA:  Shortness of breath for 3 days. The patient was diagnosed with COVID-19 11 days ago. EXAM: PORTABLE CHEST 1 VIEW COMPARISON:  None. FINDINGS: Patchy bilateral airspace disease is consistent with pneumonia. Heart size is normal. Aortic atherosclerosis noted. No pneumothorax or pleural effusion. No acute or focal bony abnormality. IMPRESSION: Patchy bilateral airspace disease consistent with pneumonia. Electronically Signed   By: Inge Rise M.D.   On: 10/13/2019 09:37    Procedures Procedures (including critical care time)  Medications Ordered in ED Medications  dexamethasone (DECADRON) injection 10 mg (10 mg Intravenous Given 10/13/19 [id number])    ED Course  I have reviewed the triage vital signs and the nursing notes.  Pertinent labs & imaging results that were available during my care of the patient were reviewed by me and considered in my medical decision making (see chart for details).    MDM Rules/Calculators/A&P                      Pt presenting known Covid 19 positive, first sx occurring on 12/11, positive test 12/12.  Increased sob requiring high flow oxygen, but comfortable with his breathing at this level.  CXR c/w covid pneumonia.  Decadron IV given. No wheezing on exam.  Pt will require admission for supportive care.  Blood cx pending.  Lab abnormalities reviewed, sig leukocytosis, d dimer elevated  C/w covid, no peripheral edema or calf pain suggesting dvt.  Hypokalemia mild.  Discussed pt with Dr. Carles Collet who accepts pt for admission.     Eduardo Dunn was evaluated in Emergency Department on 10/13/2019 for the symptoms described in the history of present illness. He was evaluated in the context of the global COVID-19 pandemic, which necessitated consideration that the patient might be at risk for infection with the SARS-CoV-2 virus that causes COVID-19. Institutional protocols and algorithms that pertain to the evaluation of patients at risk for COVID-19 are in a state of  rapid change based on information released by regulatory bodies including the CDC and federal and state organizations. These policies and algorithms were followed during the patient's care in the ED.   CRITICAL CARE Performed by: Evalee Jefferson Total critical care time: 35 minutes Critical care time was exclusive of separately billable procedures and treating other patients. Critical care was necessary to treat or prevent imminent or life-threatening deterioration. Critical care was time spent personally by me on the following activities: development of treatment plan with patient and/or surrogate as well as nursing, discussions with consultants, evaluation of patient's response to treatment, examination of patient, obtaining history from patient or surrogate, ordering and performing treatments and interventions, ordering and review of laboratory studies, ordering and review of radiographic studies, pulse oximetry and re-evaluation of patient's condition.   Final Clinical Impression(s) / ED Diagnoses Final diagnoses:  Pneumonia due to COVID-19 virus  Hypoxia  SOB (shortness of breath)    Rx / DC Orders ED Discharge Orders    None       Landis Martins 10/13/19 1028    Long, [facility], MD 10/13/19 1428

## 2019-10-13 NOTE — H&P (Addendum)
History and Physical  ESKEL BROTHERSON [id number] DOB: 08-18-1956 DOA: 10/13/2019   PCP: Lucia Gaskins, MD   Patient coming from: Home  Chief Complaint: fever, dyspnea  HPI:  Eduardo Dunn is a 63 y.o. male with medical history of hypertension and hyperlipidemia presenting with worsening fever and shortness of breath for approximately 3 days.  The patient actually had been having fevers for approximately 10 days.  He was initially tested positive for COVID-19 on 10/04/2019.  He was having fevers up to 102.5 F at that time with a nonproductive cough.  He placed himself in self quarantine after the positive Covid test.  He was not particularly short of breath when he was initially tested.  However over the past 3 days, he has noted increasing shortness of breath, but denies any chest pain, headache, hemoptysis, nausea, vomiting, diarrhea, abdominal pain, dysuria, hematuria.  He has had a nonproductive cough occasionally bringing up some yellow sputum.  He denies any rashes or joint synovitis.  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  Because of worsening shortness of breath, he came to emergency department for further evaluation.  EMS initially noted the patient to be hypoxemic with oxygen saturation 73% on room air.  He was initially placed on 4 L with oxygen saturations in the mid 80s. In the emergency department, the patient remained hypoxemic and was placed on nonrebreather.  He was afebrile hemodynamically stable.  Chest x-ray shows bilateral patchy infiltrates.  oxygen saturation is 92-94% on nonrebreather.  He has not had any increased work of breathing.  BMP was unremarkable except for potassium 3.2.  LFTs were unremarkable.  WBC was 17.2 with platelets 425,000.  CRP was 8.1.  Procalcitonin <0.10.  The patient was started on dexamethasone and remdesivir.  Assessment/Plan: Acute respiratory failure with hypoxia secondary to COVID-19 pneumonia -Presently stable on nonrebreather  without increased work of breathing -CRP 8.1 -Lactic acid 2.1 -LDH 367 -PCT <0.10 -D-dimer 1.56 -Start dexamethasone -Start remdesivir -give dose Actemra--I have discussed the risks and benefits with the patient including but not limited overwhelming infection, leukopenia with the patient.  He expressed understanding and agrees with receiving the medication   Essential hypertension -Holding lisinopril -Blood pressures well controlled -Monitor clinically  Hyperlipidemia -Continue statin  Hypokalemia -Replete       Past Medical History:  Diagnosis Date  . Hypercholesteremia   . Hypertension    Past Surgical History:  Procedure Laterality Date  . COLONOSCOPY  04/23/2012   Procedure: COLONOSCOPY;  Surgeon: Daneil Dolin, MD;  Location: [facility];  Service: Endoscopy;  Laterality: N/A;  11:00 AM  . HERNIA REPAIR     at age 66  . NECK SURGERY     Social History:  reports that he has never smoked. He has never used smokeless tobacco. He reports that he does not drink alcohol or use drugs.   Family History  Problem Relation Age of Onset  . Colon cancer Neg Hx      No Known Allergies   Prior to Admission medications   Medication Sig Start Date End Date Taking? Authorizing Provider  lisinopril (PRINIVIL,ZESTRIL) 40 MG tablet Take 40 mg by mouth daily.    [provider]  simvastatin (ZOCOR) 20 MG tablet Take 20 mg by mouth every evening.    [provider]    Review of Systems:  Constitutional:  No weight loss, night sweats,  Head&Eyes: No headache.  No vision loss.  No eye pain or scotoma ENT:  No Difficulty swallowing,Tooth/dental problems,Sore throat,  No ear ache, post nasal drip,  Cardio-vascular:  No chest pain, Orthopnea, PND, swelling in lower extremities,  dizziness, palpitations  GI:  No  abdominal pain, nausea, vomiting, diarrhea, loss of appetite, hematochezia, melena, heartburn, indigestion, Resp:   No coughing up of blood  .No wheezing.No chest wall deformity  Skin:  no rash or lesions.  GU:  no dysuria, change in color of urine, no urgency or frequency. No flank pain.  Musculoskeletal:  No joint pain or swelling. No decreased range of motion. No back pain.  Psych:  No change in mood or affect. No depression or anxiety. Neurologic: No headache, no dysesthesia, no focal weakness, no vision loss. No syncope  Physical Exam: Vitals:   10/13/19 0851 10/13/19 0852 10/13/19 0853 10/13/19 0855  BP:  121/70    Pulse:  90    Resp:  (!) 29    Temp:    98.2 F (36.8 C)  SpO2: (!) 76%   90%  Weight:   106.6 kg   Height:   5\' 7"  (1.702 m)    General:  A&O x 3, NAD, nontoxic, pleasant/cooperative Head/Eye: No conjunctival hemorrhage, no icterus, [place]/AT, No nystagmus ENT:  No icterus,  No thrush, good dentition, no pharyngeal exudate Neck:  No masses, no lymphadenpathy, no bruits CV:  RRR, no rub, no gallop, no S3 Lung:  Bilateral crackles Abdomen: soft/NT, +BS, nondistended, no peritoneal signs Ext: No cyanosis, No rashes, No petechiae, No lymphangitis, No edema Neuro: CNII-XII intact, strength 4/5 in bilateral upper and lower extremities, no dysmetria  Labs on Admission:  Basic Metabolic Panel: Recent Labs  Lab 10/13/19 0911  NA 135  K 3.2*  CL 100  CO2 21*  GLUCOSE 137*  BUN 23  CREATININE 0.81  CALCIUM 7.7*   Liver Function Tests: Recent Labs  Lab 10/13/19 0911  AST 33  ALT 26  ALKPHOS 68  BILITOT 0.7  PROT 6.9  ALBUMIN 2.8*   No results for input(s): LIPASE, AMYLASE in the last 168 hours. No results for input(s): AMMONIA in the last 168 hours. CBC: Recent Labs  Lab 10/13/19 0911  WBC 17.2*  NEUTROABS 14.1*  HGB 14.3  HCT 44.3  MCV 92.1  PLT 425*   Coagulation Profile: No results for input(s): INR, PROTIME in the last 168 hours. Cardiac Enzymes: No results for input(s): CKTOTAL, CKMB, CKMBINDEX, TROPONINI in the last 168 hours. BNP: Invalid input(s): POCBNP CBG: No  results for input(s): GLUCAP in the last 168 hours. Urine analysis: No results found for: COLORURINE, APPEARANCEUR, LABSPEC, PHURINE, GLUCOSEU, HGBUR, BILIRUBINUR, KETONESUR, [place], UROBILINOGEN, NITRITE, LEUKOCYTESUR Sepsis Labs: @LABRCNTIP (procalcitonin:4,lacticidven:4) ) Recent Results (from the past 240 hour(s))  Blood Culture (routine x 2)     Status: None (Preliminary result)   Collection Time: 10/13/19  9:11 AM   Specimen: BLOOD RIGHT ARM  Result Value Ref Range Status   Specimen Description BLOOD RIGHT ARM DRAWN BY RN  Final   Special Requests   Final    BOTTLES DRAWN AEROBIC AND ANAEROBIC Blood Culture results may not be optimal due to an inadequate volume of blood received in culture bottles Performed at [facility], [address] [place], [place] [address]    Culture PENDING  Incomplete   Report Status PENDING  Incomplete  Blood Culture (routine x 2)     Status: None (Preliminary result)   Collection Time: 10/13/19  9:16 AM   Specimen: BLOOD RIGHT  HAND  Result Value Ref Range Status   Specimen Description BLOOD RIGHT HAND DRAWN BY RN  Final   Special Requests   Final    BOTTLES DRAWN AEROBIC AND ANAEROBIC Blood Culture results may not be optimal due to an inadequate volume of blood received in culture bottles Performed at [facility], [address], [place], [place] [address]    Culture PENDING  Incomplete   Report Status PENDING  Incomplete     Radiological Exams on Admission: DG Chest Portable 1 View  Result Date: 10/13/2019 CLINICAL DATA:  Shortness of breath for 3 days. The patient was diagnosed with COVID-19 11 days ago. EXAM: PORTABLE CHEST 1 VIEW COMPARISON:  None. FINDINGS: Patchy bilateral airspace disease is consistent with pneumonia. Heart size is normal. Aortic atherosclerosis noted. No pneumothorax or pleural effusion. No acute or focal bony abnormality. IMPRESSION: Patchy bilateral airspace disease consistent with pneumonia. Electronically Signed    By: Inge Rise M.D.   On: 10/13/2019 09:37    EKG: Independently reviewed. Sinus, no STT changes    Time spent:60 minutes Code Status:   FULL Family Communication:  No Family at bedside Disposition Plan: Transfer to [place] called: none  DVT Prophylaxis: [place] Lovenox  Orson Eva, DO  [facility] Pager [phone]  If 7PM-7AM, please contact night-coverage [email or web address] Password [facility] 10/13/2019, 10:57 AM

## 2019-10-13 NOTE — Plan of Care (Signed)
  Problem: Education: Goal: Knowledge of risk factors and measures for prevention of condition will improve 10/13/2019 2046 by Raelyn Ensign, RN Outcome: Progressing 10/13/2019 2046 by Raelyn Ensign, RN Outcome: Progressing 10/13/2019 2046 by Raelyn Ensign, RN Outcome: Progressing

## 2019-10-13 NOTE — ED Triage Notes (Signed)
Pt dx with covid 11 days in [place]. SOB for 3 days. Sats 73 % when EMS arrived started on 4 L with increase to 84%

## 2019-10-13 NOTE — ED Notes (Signed)
Date and time results received: 10/13/19 0936  (use smartphrase ".now" to insert current time)  Test: lac acid Critical Value: 2.1  Name of Provider Notified: [place]  Orders Received? Or Actions Taken?: see chart

## 2019-10-14 LAB — CBC WITH DIFFERENTIAL/PLATELET
Abs Immature Granulocytes: 0.56 10*3/uL — ABNORMAL HIGH (ref 0.00–0.07)
Basophils Absolute: 0.1 10*3/uL (ref 0.0–0.1)
Basophils Relative: 0 %
Eosinophils Absolute: 0 10*3/uL (ref 0.0–0.5)
Eosinophils Relative: 0 %
HCT: 42.6 % (ref 39.0–52.0)
Hemoglobin: 13.5 g/dL (ref 13.0–17.0)
Immature Granulocytes: 3 %
Lymphocytes Relative: 9 %
Lymphs Abs: 1.6 10*3/uL (ref 0.7–4.0)
MCH: 29.3 pg (ref 26.0–34.0)
MCHC: 31.7 g/dL (ref 30.0–36.0)
MCV: 92.4 fL (ref 80.0–100.0)
Monocytes Absolute: 0.6 10*3/uL (ref 0.1–1.0)
Monocytes Relative: 4 %
Neutro Abs: 15 10*3/uL — ABNORMAL HIGH (ref 1.7–7.7)
Neutrophils Relative %: 84 %
Platelets: 424 10*3/uL — ABNORMAL HIGH (ref 150–400)
RBC: 4.61 MIL/uL (ref 4.22–5.81)
RDW: 13.4 % (ref 11.5–15.5)
WBC: 17.8 10*3/uL — ABNORMAL HIGH (ref 4.0–10.5)
nRBC: 0 % (ref 0.0–0.2)

## 2019-10-14 LAB — HIV ANTIBODY (ROUTINE TESTING W REFLEX): HIV Screen 4th Generation wRfx: NONREACTIVE

## 2019-10-14 LAB — COMPREHENSIVE METABOLIC PANEL
ALT: 38 U/L (ref 0–44)
AST: 38 U/L (ref 15–41)
Albumin: 2.7 g/dL — ABNORMAL LOW (ref 3.5–5.0)
Alkaline Phosphatase: 68 U/L (ref 38–126)
Anion gap: 11 (ref 5–15)
BUN: 32 mg/dL — ABNORMAL HIGH (ref 8–23)
CO2: 24 mmol/L (ref 22–32)
Calcium: 8.1 mg/dL — ABNORMAL LOW (ref 8.9–10.3)
Chloride: 104 mmol/L (ref 98–111)
Creatinine, Ser: 0.86 mg/dL (ref 0.61–1.24)
GFR calc Af Amer: >60 mL/min (ref >60)
GFR calc non Af Amer: >60 mL/min (ref >60)
Glucose, Bld: 126 mg/dL — ABNORMAL HIGH (ref 70–99)
Potassium: 4.6 mmol/L (ref 3.5–5.1)
Sodium: 139 mmol/L (ref 135–145)
Total Bilirubin: 0.7 mg/dL (ref 0.3–1.2)
Total Protein: 6.7 g/dL (ref 6.5–8.1)

## 2019-10-14 LAB — GLUCOSE, CAPILLARY
Glucose-Capillary: 129 mg/dL — ABNORMAL HIGH (ref 70–99)
Glucose-Capillary: 147 mg/dL — ABNORMAL HIGH (ref 70–99)
Glucose-Capillary: 171 mg/dL — ABNORMAL HIGH (ref 70–99)
Glucose-Capillary: 154 mg/dL — ABNORMAL HIGH (ref 70–99)

## 2019-10-14 LAB — C-REACTIVE PROTEIN: CRP: 4.3 mg/dL — ABNORMAL HIGH (ref <1.0)

## 2019-10-14 LAB — MAGNESIUM: Magnesium: 2.8 mg/dL — ABNORMAL HIGH (ref 1.7–2.4)

## 2019-10-14 LAB — FERRITIN: Ferritin: 250 ng/mL (ref 24–336)

## 2019-10-14 LAB — D-DIMER, QUANTITATIVE: D-Dimer, Quant: 1.9 ug/mL-FEU — ABNORMAL HIGH (ref 0.00–0.50)

## 2019-10-14 LAB — ABO/RH: ABO/RH(D): A POS

## 2019-10-14 LAB — PHOSPHORUS: Phosphorus: 5.1 mg/dL — ABNORMAL HIGH (ref 2.5–4.6)

## 2019-10-14 MED ORDER — ACETAMINOPHEN 325 MG PO TABS: 650.0000 mg | ORAL_TABLET | Freq: Four times a day (QID) | ORAL | Status: DC | PRN

## 2019-10-14 MED ORDER — ONDANSETRON HCL 4 MG PO TABS: 4.0000 mg | ORAL_TABLET | Freq: Four times a day (QID) | ORAL | Status: DC | PRN

## 2019-10-14 MED ORDER — LINAGLIPTIN 5 MG PO TABS
5.0000 mg | ORAL_TABLET | Freq: Every day | ORAL | Status: DC
  Administered 2019-10-14 – 2019-10-21 (×8): 5 mg via ORAL
  Filled 2019-10-14 (×8): qty 1

## 2019-10-14 MED ORDER — INFLUENZA VAC SPLIT QUAD 0.5 ML IM SUSY
0.5000 mL | PREFILLED_SYRINGE | INTRAMUSCULAR | Status: AC
  Administered 2019-10-15: 0.5 mL via INTRAMUSCULAR
  Filled 2019-10-14: qty 0.5

## 2019-10-14 MED ORDER — ONDANSETRON HCL 4 MG/2ML IJ SOLN: 4.0000 mg | Freq: Four times a day (QID) | INTRAMUSCULAR | Status: DC | PRN

## 2019-10-14 MED ORDER — POLYETHYLENE GLYCOL 3350 17 G PO PACK: 17.0000 g | PACK | Freq: Every day | ORAL | Status: DC | PRN

## 2019-10-14 MED ORDER — SIMVASTATIN 20 MG PO TABS
20.0000 mg | ORAL_TABLET | Freq: Every evening | ORAL | Status: DC
  Administered 2019-10-14 – 2019-10-17 (×4): 20 mg via ORAL
  Filled 2019-10-14 (×4): qty 1

## 2019-10-14 NOTE — Progress Notes (Addendum)
PROGRESS NOTE    Eduardo Dunn  [id number] DOB: 1956-06-26 DOA: 10/13/2019 PCP: Eduardo Gaskins, MD  Brief Narrative:  Eduardo Dunn is Eduardo Dunn 63 y.o. male with medical history of hypertension and hyperlipidemia presenting with worsening fever and shortness of breath for approximately 3 days.  The patient actually had been having fevers for approximately 10 days.  He was initially tested positive for COVID-19 on 10/04/2019.  He was having fevers up to 102.5 F at that time with Eduardo Dunn nonproductive cough.  He placed himself in self quarantine after the positive Covid test.  He was not particularly short of breath when he was initially tested.  However over the past 3 days, he has noted increasing shortness of breath, but denies any chest pain, headache, hemoptysis, nausea, vomiting, diarrhea, abdominal pain, dysuria, hematuria.  He has had Eduardo Dunn nonproductive cough occasionally bringing up some yellow sputum.  He denies any rashes or joint synovitis.  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  Because of worsening shortness of breath, he came to emergency department for further evaluation.  EMS initially noted the patient to be hypoxemic with oxygen saturation 73% on room air.  He was initially placed on 4 L with oxygen saturations in the mid 80s. In the emergency department, the patient remained hypoxemic and was placed on nonrebreather.  He was afebrile hemodynamically stable.  Chest x-ray shows bilateral patchy infiltrates.  oxygen saturation is 92-94% on nonrebreather.  He has not had any increased work of breathing.  BMP was unremarkable except for potassium 3.2.  LFTs were unremarkable.  WBC was 17.2 with platelets 425,000.  CRP was 8.1.  Procalcitonin <0.10.  The patient was started on dexamethasone and remdesivir.  Assessment & Plan:   Active Problems:   Pneumonia due to COVID-19 virus   Acute respiratory failure with hypoxia (HCC)  Acute respiratory failure with hypoxia secondary to COVID-19  pneumonia -Continues on NRB and HFNC -Continue steroids, remdesivir -s/p actemra on 12/21.  He declined plasma. -I/O, daily weights -prone as able, IS, OOB -inflammatory labs -12/21 CXR with patchy bilateral airspace disease  COVID-19 Labs  Recent Labs    10/13/19 0911 10/14/19 0439  DDIMER 1.56* 1.90*  FERRITIN 339* 250  LDH 367*  --   CRP 8.1* 4.3*    No results found for: [place]  Essential hypertension -Holding lisinopril -Blood pressures well controlled -Monitor clinically  Hyperlipidemia -Continue statin  Hypokalemia -replace and follow  DVT prophylaxis: BID lovenox given critical illness Code Status: full  Family Communication: none at bedside - discussed with with wife Disposition Plan: pending further improvement   Consultants:   none  Procedures:   none  Antimicrobials Anti-infectives (From admission, onward)   Start     Dose/Rate Route Frequency Ordered Stop   10/14/19 1000  remdesivir 100 mg in sodium chloride 0.9 % 100 mL IVPB     100 mg 200 mL/hr over 30 Minutes Intravenous Daily 10/13/19 1028 10/18/19 0959   10/13/19 1130  remdesivir 200 mg in sodium chloride 0.9% 250 mL IVPB     200 mg 580 mL/hr over 30 Minutes Intravenous Once 10/13/19 1028 10/13/19 1240     Subjective: Feels Eduardo Dunn little better today Finally got to sleep last night  Objective: Vitals:   10/14/19 0459 10/14/19 0722 10/14/19 1108 10/14/19 1233  BP: 116/70  117/72   Pulse: 70   80  Resp: (!) 21   20  Temp: 98.2 F (36.8 C) 99 F (37.2 C)  98 F (36.7 C)   TempSrc: Other (Comment) Oral Oral   SpO2: 90%   (!) 88%  Weight:      Height:        Intake/Output Summary (Last 24 hours) at 10/14/2019 1320 Last data filed at 10/14/2019 0000 Gross per 24 hour  Intake 150 ml  Output 450 ml  Net -300 ml   Filed Weights   10/13/19 0853  Weight: 106.6 kg    Examination:  General exam: Appears calm and comfortable  Respiratory system: Clear to  auscultation. Cardiovascular system: RRR Gastrointestinal system: Abdomen is nondistended, soft and nontender.  Central nervous system: Alert and oriented. No focal neurological deficits. Extremities: no LEE Skin: No rashes, lesions or ulcers Psychiatry: Judgement and insight appear normal. Mood & affect appropriate.     Data Reviewed: I have personally reviewed following labs and imaging studies  CBC: Recent Labs  Lab 10/13/19 0911 10/14/19 0439  WBC 17.2* 17.8*  NEUTROABS 14.1* 15.0*  HGB 14.3 13.5  HCT 44.3 42.6  MCV 92.1 92.4  PLT 425* 123456*   Basic Metabolic Panel: Recent Labs  Lab 10/13/19 0911 10/13/19 1105 10/14/19 0439  NA 135  --  139  K 3.2*  --  4.6  CL 100  --  104  CO2 21*  --  24  GLUCOSE 137*  --  126*  BUN 23  --  32*  CREATININE 0.81  --  0.86  CALCIUM 7.7*  --  8.1*  MG  --  2.5* 2.8*  PHOS  --   --  5.1*   GFR: Estimated Creatinine Clearance: 105 mL/min (by C-G formula based on SCr of 0.86 mg/dL). Liver Function Tests: Recent Labs  Lab 10/13/19 0911 10/14/19 0439  AST 33 38  ALT 26 38  ALKPHOS 68 68  BILITOT 0.7 0.7  PROT 6.9 6.7  ALBUMIN 2.8* 2.7*   No results for input(s): LIPASE, AMYLASE in the last 168 hours. No results for input(s): AMMONIA in the last 168 hours. Coagulation Profile: No results for input(s): INR, PROTIME in the last 168 hours. Cardiac Enzymes: No results for input(s): CKTOTAL, CKMB, CKMBINDEX, TROPONINI in the last 168 hours. BNP (last 3 results) No results for input(s): PROBNP in the last 8760 hours. HbA1C: Recent Labs    10/13/19 0900  HGBA1C 6.7*   CBG: Recent Labs  Lab 10/13/19 1820 10/13/19 2124 10/14/19 0719 10/14/19 1106  GLUCAP 227* 153* 129* 147*   Lipid Profile: Recent Labs    10/13/19 0911  TRIG 185*   Thyroid Function Tests: No results for input(s): TSH, T4TOTAL, FREET4, T3FREE, THYROIDAB in the last 72 hours. Anemia Panel: Recent Labs    10/13/19 0911 10/14/19 0439   FERRITIN 339* 250   Sepsis Labs: Recent Labs  Lab 10/13/19 0911 10/13/19 1105  PROCALCITON <0.10  --   LATICACIDVEN 2.1* 1.3    Recent Results (from the past 240 hour(s))  Blood Culture (routine x 2)     Status: None (Preliminary result)   Collection Time: 10/13/19  9:11 AM   Specimen: BLOOD RIGHT ARM  Result Value Ref Range Status   Specimen Description BLOOD RIGHT ARM DRAWN BY RN  Final   Special Requests   Final    BOTTLES DRAWN AEROBIC AND ANAEROBIC Blood Culture results may not be optimal due to an inadequate volume of blood received in culture bottles   Culture   Final    NO GROWTH < 24 HOURS Performed at [facility], [address], [place], [place] [address]    Report Status PENDING  Incomplete  Blood Culture (routine x 2)     Status: None (Preliminary result)   Collection Time: 10/13/19  9:16 AM   Specimen: BLOOD RIGHT HAND  Result Value Ref Range Status   Specimen Description BLOOD RIGHT HAND DRAWN BY RN  Final   Special Requests   Final    BOTTLES DRAWN AEROBIC AND ANAEROBIC Blood Culture results may not be optimal due to an inadequate volume of blood received in culture bottles   Culture   Final    NO GROWTH < 24 HOURS Performed at [facility], [address], [place], [place] [address]    Report Status PENDING  Incomplete         Radiology Studies: DG Chest Portable 1 View  Result Date: 10/13/2019 CLINICAL DATA:  Shortness of breath for 3 days. The patient was diagnosed with COVID-19 11 days ago. EXAM: PORTABLE CHEST 1 VIEW COMPARISON:  None. FINDINGS: Patchy bilateral airspace disease is consistent with pneumonia. Heart size is normal. Aortic atherosclerosis noted. No pneumothorax or pleural effusion. No acute or focal bony abnormality. IMPRESSION: Patchy bilateral airspace disease consistent with pneumonia. Electronically Signed   By: Inge Rise M.D.   On: 10/13/2019 09:37        Scheduled Meds: . enoxaparin (LOVENOX) injection  40 mg  Subcutaneous BID  . insulin aspart  0-5 Units Subcutaneous QHS  . insulin aspart  0-9 Units Subcutaneous TID WC  . insulin detemir  0.075 Units/kg Subcutaneous BID  . linagliptin  5 mg Oral Daily  . methylPREDNISolone (SOLU-MEDROL) injection  50 mg Intravenous Q12H  . pantoprazole  40 mg Oral Daily  . simvastatin  20 mg Oral QPM   Continuous Infusions: . remdesivir 100 mg in NS 100 mL 100 mg (10/14/19 1005)     LOS: 1 day    Time spent: over 97 min    Fayrene Helper, MD [facility] Pager [email or web address]  If 7PM-7AM, please contact night-coverage [email or web address] Password [email or web address] 10/14/2019, 1:20 PM

## 2019-10-14 NOTE — Progress Notes (Signed)
Inpatient Diabetes Program Recommendations  AACE/ADA: New Consensus Statement on Inpatient Glycemic Control (2015)  Target Ranges:  Prepandial:   less than 140 mg/dL      Peak postprandial:   less than 180 mg/dL (1-2 hours)      Critically ill patients:  140 - 180 mg/dL   Results for Eduardo Dunn, Eduardo Dunn (MRN [id number]) as of 10/14/2019 06:45  Ref. Range 10/13/2019 18:20 10/13/2019 21:24  Glucose-Capillary Latest Ref Range: 70 - 99 mg/dL 227 (H)  3 units NOVOLOG  153 (H)    8 units LEVEMIR   Results for Eduardo Dunn, Eduardo Dunn (MRN [id number]) as of 10/14/2019 06:45  Ref. Range 10/13/2019 09:00  Hemoglobin A1C Latest Ref Range: 4.8 - 5.6 % 6.7 (H)    Admit with: Acute respiratory failure with hypoxia secondary to COVID-19 pneumonia  NO History of Diabetes noted in H&P    Current Insulin Orders: Levemir 8 units BID        Novolog Sensitive Correction Scale/ SSI (0-9 units) TID AC + HS     Getting Solumedrol 50 mg BID.    Insulin started last PM for glucose elevations.   MD- Note that patient does not have History of Diabetes mentioned in H&P.  Current A1c= 6.7%.  Is this a new diagnosis or possibly elevated from acute illness (diagnosed with COVID 10/04/2019)??     --Will follow patient during hospitalization--  Wyn Quaker RN, MSN, CDE Diabetes Coordinator Inpatient Glycemic Control Team Team Pager: [phone] (8a-5p)

## 2019-10-14 NOTE — Progress Notes (Signed)
Patient independent with transfers and movement in bed.  Continues on HFNC and Non-rebreather mask to keep sats in the low 90's range.  Denies pain or nausea.  Patient is AO x3, low fall risk per scale.

## 2019-10-15 LAB — COMPREHENSIVE METABOLIC PANEL
ALT: 49 U/L — ABNORMAL HIGH (ref 0–44)
AST: 35 U/L (ref 15–41)
Albumin: 2.7 g/dL — ABNORMAL LOW (ref 3.5–5.0)
Alkaline Phosphatase: 63 U/L (ref 38–126)
Anion gap: 9 (ref 5–15)
BUN: 36 mg/dL — ABNORMAL HIGH (ref 8–23)
CO2: 23 mmol/L (ref 22–32)
Calcium: 8.2 mg/dL — ABNORMAL LOW (ref 8.9–10.3)
Chloride: 107 mmol/L (ref 98–111)
Creatinine, Ser: 0.7 mg/dL (ref 0.61–1.24)
GFR calc Af Amer: >60 mL/min (ref >60)
GFR calc non Af Amer: >60 mL/min (ref >60)
Glucose, Bld: 150 mg/dL — ABNORMAL HIGH (ref 70–99)
Potassium: 4.8 mmol/L (ref 3.5–5.1)
Sodium: 139 mmol/L (ref 135–145)
Total Bilirubin: 0.8 mg/dL (ref 0.3–1.2)
Total Protein: 6.3 g/dL — ABNORMAL LOW (ref 6.5–8.1)

## 2019-10-15 LAB — CBC WITH DIFFERENTIAL/PLATELET
Abs Immature Granulocytes: 0.57 10*3/uL — ABNORMAL HIGH (ref 0.00–0.07)
Basophils Absolute: 0.1 10*3/uL (ref 0.0–0.1)
Basophils Relative: 0 %
Eosinophils Absolute: 0 10*3/uL (ref 0.0–0.5)
Eosinophils Relative: 0 %
HCT: 41.9 % (ref 39.0–52.0)
Hemoglobin: 13.4 g/dL (ref 13.0–17.0)
Immature Granulocytes: 3 %
Lymphocytes Relative: 6 %
Lymphs Abs: 1.2 10*3/uL (ref 0.7–4.0)
MCH: 29.5 pg (ref 26.0–34.0)
MCHC: 32 g/dL (ref 30.0–36.0)
MCV: 92.1 fL (ref 80.0–100.0)
Monocytes Absolute: 0.5 10*3/uL (ref 0.1–1.0)
Monocytes Relative: 2 %
Neutro Abs: 18.3 10*3/uL — ABNORMAL HIGH (ref 1.7–7.7)
Neutrophils Relative %: 89 %
Platelets: 416 10*3/uL — ABNORMAL HIGH (ref 150–400)
RBC: 4.55 MIL/uL (ref 4.22–5.81)
RDW: 13.2 % (ref 11.5–15.5)
WBC: 20.5 10*3/uL — ABNORMAL HIGH (ref 4.0–10.5)
nRBC: 0 % (ref 0.0–0.2)

## 2019-10-15 LAB — GLUCOSE, CAPILLARY
Glucose-Capillary: 127 mg/dL — ABNORMAL HIGH (ref 70–99)
Glucose-Capillary: 183 mg/dL — ABNORMAL HIGH (ref 70–99)
Glucose-Capillary: 173 mg/dL — ABNORMAL HIGH (ref 70–99)
Glucose-Capillary: 163 mg/dL — ABNORMAL HIGH (ref 70–99)

## 2019-10-15 LAB — FERRITIN: Ferritin: 264 ng/mL (ref 24–336)

## 2019-10-15 LAB — D-DIMER, QUANTITATIVE: D-Dimer, Quant: 1.72 ug/mL-FEU — ABNORMAL HIGH (ref 0.00–0.50)

## 2019-10-15 LAB — C-REACTIVE PROTEIN: CRP: 1.7 mg/dL — ABNORMAL HIGH (ref <1.0)

## 2019-10-15 LAB — PHOSPHORUS: Phosphorus: 4.8 mg/dL — ABNORMAL HIGH (ref 2.5–4.6)

## 2019-10-15 LAB — MAGNESIUM: Magnesium: 2.4 mg/dL (ref 1.7–2.4)

## 2019-10-15 MED ORDER — ENOXAPARIN SODIUM 60 MG/0.6ML ~~LOC~~ SOLN: 0.5000 mg/kg | Freq: Every day | SUBCUTANEOUS | Status: DC

## 2019-10-15 MED ORDER — SODIUM CHLORIDE 0.9 % IV SOLN: 200.0000 mg | Freq: Once | INTRAVENOUS | Status: DC

## 2019-10-15 MED ORDER — ENOXAPARIN SODIUM 40 MG/0.4ML ~~LOC~~ SOLN
40.0000 mg | Freq: Two times a day (BID) | SUBCUTANEOUS | Status: DC
  Administered 2019-10-15 – 2019-10-21 (×12): 40 mg via SUBCUTANEOUS
  Filled 2019-10-15 (×12): qty 0.4

## 2019-10-15 MED ORDER — SODIUM CHLORIDE 0.9 % IV SOLN: 100.0000 mg | Freq: Every day | INTRAVENOUS | Status: DC

## 2019-10-15 MED ORDER — DEXAMETHASONE 6 MG PO TABS
6.0000 mg | ORAL_TABLET | ORAL | Status: DC
  Administered 2019-10-15: 16:00:00 6 mg via ORAL
  Filled 2019-10-15: qty 1

## 2019-10-15 NOTE — Plan of Care (Signed)
Patient has had no c/o throughout shift today. SPO2 decreased to low 80's during lunch today, but has otherwise remained >90%. PO Decadron started today per service. Patient cnts to have good PO intake despite desatting. Educated on deep breathing exercises and use of incentive spirometer.   Problem: Respiratory: Goal: Will maintain a patent airway Outcome: Progressing Goal: Complications related to the disease process, condition or treatment will be avoided or minimized Outcome: Progressing

## 2019-10-15 NOTE — Progress Notes (Addendum)
PROGRESS NOTE    Eduardo Dunn  [id number] DOB: 01/16/1956 DOA: 10/13/2019 PCP: Lucia Gaskins, MD  Brief Narrative:  Eduardo Dunn is Eduardo Dunn 63 y.o. male with medical history of hypertension and hyperlipidemia presenting with worsening fever and shortness of breath for approximately 3 days.  The patient actually had been having fevers for approximately 10 days.  He was initially tested positive for COVID-19 on 10/04/2019.  He was having fevers up to 102.5 F at that time with Kvion Shapley nonproductive cough.  He placed himself in self quarantine after the positive Covid test.  He was not particularly short of breath when he was initially tested.  However over the past 3 days, he has noted increasing shortness of breath, but denies any chest pain, headache, hemoptysis, nausea, vomiting, diarrhea, abdominal pain, dysuria, hematuria.  He has had Ophie Burrowes nonproductive cough occasionally bringing up some yellow sputum.  He denies any rashes or joint synovitis.  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  Because of worsening shortness of breath, he came to emergency department for further evaluation.  EMS initially noted the patient to be hypoxemic with oxygen saturation 73% on room air.  He was initially placed on 4 L with oxygen saturations in the mid 80s. In the emergency department, the patient remained hypoxemic and was placed on nonrebreather.  He was afebrile hemodynamically stable.  Chest x-ray shows bilateral patchy infiltrates.  oxygen saturation is 92-94% on nonrebreather.  He has not had any increased work of breathing.  BMP was unremarkable except for potassium 3.2.  LFTs were unremarkable.  WBC was 17.2 with platelets 425,000.  CRP was 8.1.  Procalcitonin <0.10.  The patient was started on dexamethasone and remdesivir.  Assessment & Plan:   Active Problems:   Pneumonia due to COVID-19 virus   Acute respiratory failure with hypoxia (HCC)  Acute respiratory failure with hypoxia secondary to COVID-19  pneumonia -Continues on NRB and HFNC, relatively stable today -Continue steroids, remdesivir -s/p actemra on 12/21.  He declined plasma. -I/O, daily weights -prone as able, IS, OOB -inflammatory labs -12/21 CXR with patchy bilateral airspace disease  COVID-19 Labs  Recent Labs    10/13/19 0911 10/14/19 0439 10/15/19 0220  DDIMER 1.56* 1.90* 1.72*  FERRITIN 339* 250 264  LDH 367*  --   --   CRP 8.1* 4.3* 1.7*    No results found for: SARSCOV2NAA  T2DM: continue levemir and SSI, add tradjenta  Essential hypertension -Holding lisinopril -Blood pressures well controlled -Monitor clinically  Hyperlipidemia -Continue statin  Hypokalemia -replace and follow  DVT prophylaxis: BID lovenox given critical illness Code Status: full  Family Communication: none at bedside - discussed with with wife. Called 12/23, no answer. Disposition Plan: pending further improvement   Consultants:   none  Procedures:   none  Antimicrobials Anti-infectives (From admission, onward)   Start     Dose/Rate Route Frequency Ordered Stop   10/16/19 1000  remdesivir 100 mg in sodium chloride 0.9 % 100 mL IVPB  Status:  Discontinued     100 mg 200 mL/hr over 30 Minutes Intravenous Daily 10/15/19 1405 10/15/19 1415   10/16/19 1000  remdesivir 100 mg in sodium chloride 0.9 % 100 mL IVPB     100 mg 200 mL/hr over 30 Minutes Intravenous Daily 10/15/19 1417 10/18/19 0959   10/15/19 1415  remdesivir 200 mg in sodium chloride 0.9% 250 mL IVPB  Status:  Discontinued     200 mg 580 mL/hr over 30 Minutes  Intravenous Once 10/15/19 1405 10/15/19 1415   10/14/19 1000  remdesivir 100 mg in sodium chloride 0.9 % 100 mL IVPB     100 mg 200 mL/hr over 30 Minutes Intravenous Daily 10/13/19 1028 10/18/19 0959   10/13/19 1130  remdesivir 200 mg in sodium chloride 0.9% 250 mL IVPB     200 mg 580 mL/hr over 30 Minutes Intravenous Once 10/13/19 1028 10/13/19 1240     Subjective: No new  complaints  Objective: Vitals:   10/15/19 0945 10/15/19 0946 10/15/19 1210 10/15/19 1606  BP:   127/78 133/76  Pulse: 71 71 74 74  Resp: (!) 25 (!) 25 20 18   Temp:   98 F (36.7 C) 98.7 F (37.1 C)  TempSrc:   Oral Oral  SpO2: 95% 96% 91% 94%  Weight:      Height:        Intake/Output Summary (Last 24 hours) at 10/15/2019 1619 Last data filed at 10/15/2019 1300 Gross per 24 hour  Intake 960 ml  Output 1550 ml  Net -590 ml   Filed Weights   10/13/19 0853  Weight: 106.6 kg    Examination:  General: No acute distress. Cardiovascular: RRR Lungs: Clear to auscultation bilaterally Abdomen: Soft, nontender, nondistended  Neurological: Alert and oriented 3. Moves all extremities 4. Cranial nerves II through XII grossly intact. Skin: Warm and dry. No rashes or lesions. Extremities: No clubbing or cyanosis. No edema.  Data Reviewed: I have personally reviewed following labs and imaging studies  CBC: Recent Labs  Lab 10/13/19 0911 10/14/19 0439 10/15/19 0220  WBC 17.2* 17.8* 20.5*  NEUTROABS 14.1* 15.0* 18.3*  HGB 14.3 13.5 13.4  HCT 44.3 42.6 41.9  MCV 92.1 92.4 92.1  PLT 425* 424* 123456*   Basic Metabolic Panel: Recent Labs  Lab 10/13/19 0911 10/13/19 1105 10/14/19 0439 10/15/19 0220  NA 135  --  139 139  K 3.2*  --  4.6 4.8  CL 100  --  104 107  CO2 21*  --  24 23  GLUCOSE 137*  --  126* 150*  BUN 23  --  32* 36*  CREATININE 0.81  --  0.86 0.70  CALCIUM 7.7*  --  8.1* 8.2*  MG  --  2.5* 2.8* 2.4  PHOS  --   --  5.1* 4.8*   GFR: Estimated Creatinine Clearance: 112.9 mL/min (by C-G formula based on SCr of 0.7 mg/dL). Liver Function Tests: Recent Labs  Lab 10/13/19 0911 10/14/19 0439 10/15/19 0220  AST 33 38 35  ALT 26 38 49*  ALKPHOS 68 68 63  BILITOT 0.7 0.7 0.8  PROT 6.9 6.7 6.3*  ALBUMIN 2.8* 2.7* 2.7*   No results for input(s): LIPASE, AMYLASE in the last 168 hours. No results for input(s): AMMONIA in the last 168 hours. Coagulation  Profile: No results for input(s): INR, PROTIME in the last 168 hours. Cardiac Enzymes: No results for input(s): CKTOTAL, CKMB, CKMBINDEX, TROPONINI in the last 168 hours. BNP (last 3 results) No results for input(s): PROBNP in the last 8760 hours. HbA1C: Recent Labs    10/13/19 0900  HGBA1C 6.7*   CBG: Recent Labs  Lab 10/14/19 1106 10/14/19 1536 10/14/19 2120 10/15/19 0747 10/15/19 1214  GLUCAP 147* 171* 154* 127* 183*   Lipid Profile: Recent Labs    10/13/19 0911  TRIG 185*   Thyroid Function Tests: No results for input(s): TSH, T4TOTAL, FREET4, T3FREE, THYROIDAB in the last 72 hours. Anemia Panel: Recent Labs  10/14/19 0439 10/15/19 0220  FERRITIN 250 264   Sepsis Labs: Recent Labs  Lab 10/13/19 0911 10/13/19 1105  PROCALCITON <0.10  --   LATICACIDVEN 2.1* 1.3    Recent Results (from the past 240 hour(s))  Blood Culture (routine x 2)     Status: None (Preliminary result)   Collection Time: 10/13/19  9:11 AM   Specimen: BLOOD RIGHT ARM  Result Value Ref Range Status   Specimen Description BLOOD RIGHT ARM DRAWN BY RN  Final   Special Requests   Final    BOTTLES DRAWN AEROBIC AND ANAEROBIC Blood Culture results may not be optimal due to an inadequate volume of blood received in culture bottles   Culture   Final    NO GROWTH 2 DAYS Performed at [facility], [address], [place], [place] [address]    Report Status PENDING  Incomplete  Blood Culture (routine x 2)     Status: None (Preliminary result)   Collection Time: 10/13/19  9:16 AM   Specimen: BLOOD RIGHT HAND  Result Value Ref Range Status   Specimen Description BLOOD RIGHT HAND DRAWN BY RN  Final   Special Requests   Final    BOTTLES DRAWN AEROBIC AND ANAEROBIC Blood Culture results may not be optimal due to an inadequate volume of blood received in culture bottles   Culture   Final    NO GROWTH 2 DAYS Performed at [facility], [address] [place],  [address]    Report Status  PENDING  Incomplete         Radiology Studies: No results found.      Scheduled Meds: . dexamethasone  6 mg Oral Q24H  . enoxaparin (LOVENOX) injection  40 mg Subcutaneous BID  . insulin aspart  0-5 Units Subcutaneous QHS  . insulin aspart  0-9 Units Subcutaneous TID WC  . insulin detemir  0.075 Units/kg Subcutaneous BID  . linagliptin  5 mg Oral Daily  . methylPREDNISolone (SOLU-MEDROL) injection  50 mg Intravenous Q12H  . pantoprazole  40 mg Oral Daily  . simvastatin  20 mg Oral QPM   Continuous Infusions: . remdesivir 100 mg in NS 100 mL 100 mg (10/15/19 0938)  . [START ON 10/16/2019] remdesivir 100 mg in NS 100 mL       LOS: 2 days    Time spent: over 30 min    Fayrene Helper, MD [facility] Pager AMION  If 7PM-7AM, please contact night-coverage [email or web address] Password [facility] 10/15/2019, 4:19 PM

## 2019-10-15 NOTE — Progress Notes (Signed)
Patient states feeling a little better today than the night before.  Lungs sound improved from last night as well.  Continues on HFNC 15L and non-rebreather.  Sats rarely dropping below 88% even when sleeping soundly which is also an improvement from the night before.  Patient states no needs.

## 2019-10-16 LAB — HEPATITIS PANEL, ACUTE
HCV Ab: NONREACTIVE
Hep A IgM: NONREACTIVE
Hep B C IgM: NONREACTIVE
Hepatitis B Surface Ag: NONREACTIVE

## 2019-10-16 LAB — COMPREHENSIVE METABOLIC PANEL
ALT: 260 U/L — ABNORMAL HIGH (ref 0–44)
AST: 143 U/L — ABNORMAL HIGH (ref 15–41)
Albumin: 2.7 g/dL — ABNORMAL LOW (ref 3.5–5.0)
Alkaline Phosphatase: 76 U/L (ref 38–126)
Anion gap: 11 (ref 5–15)
BUN: 32 mg/dL — ABNORMAL HIGH (ref 8–23)
CO2: 23 mmol/L (ref 22–32)
Calcium: 8.3 mg/dL — ABNORMAL LOW (ref 8.9–10.3)
Chloride: 104 mmol/L (ref 98–111)
Creatinine, Ser: 0.71 mg/dL (ref 0.61–1.24)
GFR calc Af Amer: >60 mL/min (ref >60)
GFR calc non Af Amer: >60 mL/min (ref >60)
Glucose, Bld: 166 mg/dL — ABNORMAL HIGH (ref 70–99)
Potassium: 4.7 mmol/L (ref 3.5–5.1)
Sodium: 138 mmol/L (ref 135–145)
Total Bilirubin: 0.7 mg/dL (ref 0.3–1.2)
Total Protein: 6 g/dL — ABNORMAL LOW (ref 6.5–8.1)

## 2019-10-16 LAB — CBC WITH DIFFERENTIAL/PLATELET
Abs Immature Granulocytes: 0.45 10*3/uL — ABNORMAL HIGH (ref 0.00–0.07)
Basophils Absolute: 0.1 10*3/uL (ref 0.0–0.1)
Basophils Relative: 0 %
Eosinophils Absolute: 0 10*3/uL (ref 0.0–0.5)
Eosinophils Relative: 0 %
HCT: 43 % (ref 39.0–52.0)
Hemoglobin: 13.9 g/dL (ref 13.0–17.0)
Immature Granulocytes: 2 %
Lymphocytes Relative: 4 %
Lymphs Abs: 0.9 10*3/uL (ref 0.7–4.0)
MCH: 29.5 pg (ref 26.0–34.0)
MCHC: 32.3 g/dL (ref 30.0–36.0)
MCV: 91.3 fL (ref 80.0–100.0)
Monocytes Absolute: 0.5 10*3/uL (ref 0.1–1.0)
Monocytes Relative: 2 %
Neutro Abs: 20.6 10*3/uL — ABNORMAL HIGH (ref 1.7–7.7)
Neutrophils Relative %: 92 %
Platelets: 432 10*3/uL — ABNORMAL HIGH (ref 150–400)
RBC: 4.71 MIL/uL (ref 4.22–5.81)
RDW: 13.1 % (ref 11.5–15.5)
WBC: 22.4 10*3/uL — ABNORMAL HIGH (ref 4.0–10.5)
nRBC: 0 % (ref 0.0–0.2)

## 2019-10-16 LAB — GLUCOSE, CAPILLARY
Glucose-Capillary: 164 mg/dL — ABNORMAL HIGH (ref 70–99)
Glucose-Capillary: 147 mg/dL — ABNORMAL HIGH (ref 70–99)
Glucose-Capillary: 183 mg/dL — ABNORMAL HIGH (ref 70–99)

## 2019-10-16 LAB — D-DIMER, QUANTITATIVE: D-Dimer, Quant: 1.79 ug/mL-FEU — ABNORMAL HIGH (ref 0.00–0.50)

## 2019-10-16 LAB — FERRITIN: Ferritin: 267 ng/mL (ref 24–336)

## 2019-10-16 LAB — PHOSPHORUS: Phosphorus: 4.3 mg/dL (ref 2.5–4.6)

## 2019-10-16 LAB — C-REACTIVE PROTEIN: CRP: 0.9 mg/dL (ref <1.0)

## 2019-10-16 LAB — MAGNESIUM: Magnesium: 2.3 mg/dL (ref 1.7–2.4)

## 2019-10-16 MED ORDER — SALINE SPRAY 0.65 % NA SOLN
1.0000 | NASAL | Status: DC | PRN
  Administered 2019-10-16: 17:00:00 1 via NASAL
  Filled 2019-10-16: qty 44

## 2019-10-16 NOTE — Progress Notes (Signed)
PROGRESS NOTE    Eduardo Dunn  [id number] DOB: January 20, 1956 DOA: 10/13/2019 PCP: Lucia Gaskins, MD  Brief Narrative:  Eduardo Dunn is Eduardo Dunn 63 y.o. Dunn with medical history of hypertension and hyperlipidemia presenting with worsening fever and shortness of breath for approximately 3 days.  The patient actually had been having fevers for approximately 10 days.  He was initially tested positive for COVID-19 on 10/04/2019.  He was having fevers up to 102.5 F at that time with Fayette Gasner nonproductive cough.  He placed himself in self quarantine after the positive Covid test.  He was not particularly short of breath when he was initially tested.  However over the past 3 days, he has noted increasing shortness of breath, but denies any chest pain, headache, hemoptysis, nausea, vomiting, diarrhea, abdominal pain, dysuria, hematuria.  He has had Dallas Scorsone nonproductive cough occasionally bringing up some yellow sputum.  He denies any rashes or joint synovitis.  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  Because of worsening shortness of breath, he came to emergency department for further evaluation.  EMS initially noted the patient to be hypoxemic with oxygen saturation 73% on room air.  He was initially placed on 4 L with oxygen saturations in the mid 80s. In the emergency department, the patient remained hypoxemic and was placed on nonrebreather.  He was afebrile hemodynamically stable.  Chest x-ray shows bilateral patchy infiltrates.  oxygen saturation is 92-94% on nonrebreather.  He has not had any increased work of breathing.  BMP was unremarkable except for potassium 3.2.  LFTs were unremarkable.  WBC was 17.2 with platelets 425,000.  CRP was 8.1.  Procalcitonin <0.10.  The patient was started on dexamethasone and remdesivir.  Assessment & Plan:   Active Problems:   Pneumonia due to COVID-19 virus   Acute respiratory failure with hypoxia (HCC)  Acute respiratory failure with hypoxia secondary to COVID-19  pneumonia -On 10 L [place] with NRB today, will continue to wean as tolerated -Continue steroids (day 4), will hold remdesivir and follow LFT's.  Consider resumption tomorrow if improvement, ~5 x ULN at this time.  Recommended hold when > 10 x, though will follow trend before resuming. -s/p actemra on 12/21.  He declined plasma. -I/O, daily weights -prone as able, IS, OOB -inflammatory labs -12/21 CXR with patchy bilateral airspace disease  COVID-19 Labs  Recent Labs    10/14/19 0439 10/15/19 0220 10/16/19 0500  DDIMER 1.90* 1.72* 1.79*  FERRITIN 250 264 267  CRP 4.3* 1.7* 0.9    No results found for: SARSCOV2NAA  T2DM: continue levemir and SSI, add tradjenta.  BG's reasonable, follow.  Essential hypertension -Holding lisinopril -Blood pressures well controlled -Monitor clinically  Hyperlipidemia -Continue statin  Hypokalemia -replace and follow  Elevated LFT's: follow acute hepatitis panel, RUQ US  DVT prophylaxis: BID lovenox given critical illness Code Status: full  Family Communication: none at bedside - discussed with with wife. Called 12/24. Disposition Plan: pending further improvement   Consultants:   none  Procedures:   none  Antimicrobials Anti-infectives (From admission, onward)   Start     Dose/Rate Route Frequency Ordered Stop   10/16/19 1000  remdesivir 100 mg in sodium chloride 0.9 % 100 mL IVPB  Status:  Discontinued     100 mg 200 mL/hr over 30 Minutes Intravenous Daily 10/15/19 1405 10/15/19 1415   10/16/19 1000  remdesivir 100 mg in sodium chloride 0.9 % 100 mL IVPB  Status:  Discontinued  100 mg 200 mL/hr over 30 Minutes Intravenous Daily 10/15/19 1417 10/16/19 0802   10/15/19 1415  remdesivir 200 mg in sodium chloride 0.9% 250 mL IVPB  Status:  Discontinued     200 mg 580 mL/hr over 30 Minutes Intravenous Once 10/15/19 1405 10/15/19 1415   10/14/19 1000  remdesivir 100 mg in sodium chloride 0.9 % 100 mL IVPB  Status:  Discontinued      100 mg 200 mL/hr over 30 Minutes Intravenous Daily 10/13/19 1028 10/15/19 1921   10/13/19 1130  remdesivir 200 mg in sodium chloride 0.9% 250 mL IVPB     200 mg 580 mL/hr over 30 Minutes Intravenous Once 10/13/19 1028 10/13/19 1240     Subjective: No new complaints today Asking for Liddie Chichester washcloth  Objective: Vitals:   10/16/19 1053 10/16/19 1100 10/16/19 1200 10/16/19 1339  BP:   121/77   Pulse: 68 74 72 70  Resp: (!) 27 20 16  (!) 22  Temp:   98.9 F (37.2 C)   TempSrc:   Oral   SpO2: (!) 85% 93% 92%   Weight:      Height:        Intake/Output Summary (Last 24 hours) at 10/16/2019 1737 Last data filed at 10/16/2019 1400 Gross per 24 hour  Intake 360 ml  Output 1100 ml  Net -740 ml   Filed Weights   10/13/19 0853  Weight: 106.6 kg    Examination:  General: No acute distress. Cardiovascular: Heart sounds show Ottilie Wigglesworth regular rate, and rhythm. . Lungs: No adventitious lung sounds Abdomen: Soft, nontender, nondistended Neurological: Alert and oriented 3. Moves all extremities 4. Cranial nerves II through XII grossly intact. Skin: Warm and dry. No rashes or lesions. Extremities: No clubbing or cyanosis. No edema.    Data Reviewed: I have personally reviewed following labs and imaging studies  CBC: Recent Labs  Lab 10/13/19 0911 10/14/19 0439 10/15/19 0220 10/16/19 0500  WBC 17.2* 17.8* 20.5* 22.4*  NEUTROABS 14.1* 15.0* 18.3* 20.6*  HGB 14.3 13.5 13.4 13.9  HCT 44.3 42.6 41.9 43.0  MCV 92.1 92.4 92.1 91.3  PLT 425* 424* 416* 123456*   Basic Metabolic Panel: Recent Labs  Lab 10/13/19 0911 10/13/19 1105 10/14/19 0439 10/15/19 0220 10/16/19 0500  NA 135  --  139 139 138  K 3.2*  --  4.6 4.8 4.7  CL 100  --  104 107 104  CO2 21*  --  24 23 23   GLUCOSE 137*  --  126* 150* 166*  BUN 23  --  32* 36* 32*  CREATININE 0.81  --  0.86 0.70 0.71  CALCIUM 7.7*  --  8.1* 8.2* 8.3*  MG  --  2.5* 2.8* 2.4 2.3  PHOS  --   --  5.1* 4.8* 4.3   GFR: Estimated  Creatinine Clearance: 112.9 mL/min (by C-G formula based on SCr of 0.71 mg/dL). Liver Function Tests: Recent Labs  Lab 10/13/19 0911 10/14/19 0439 10/15/19 0220 10/16/19 0500  AST 33 38 35 143*  ALT 26 38 49* 260*  ALKPHOS 68 68 63 76  BILITOT 0.7 0.7 0.8 0.7  PROT 6.9 6.7 6.3* 6.0*  ALBUMIN 2.8* 2.7* 2.7* 2.7*   No results for input(s): LIPASE, AMYLASE in the last 168 hours. No results for input(s): AMMONIA in the last 168 hours. Coagulation Profile: No results for input(s): INR, PROTIME in the last 168 hours. Cardiac Enzymes: No results for input(s): CKTOTAL, CKMB, CKMBINDEX, TROPONINI in the last 168 hours. BNP (last  3 results) No results for input(s): PROBNP in the last 8760 hours. HbA1C: No results for input(s): HGBA1C in the last 72 hours. CBG: Recent Labs  Lab 10/15/19 1214 10/15/19 1523 10/15/19 2128 10/16/19 0759 10/16/19 1715  GLUCAP 183* 173* 163* 164* 147*   Lipid Profile: No results for input(s): CHOL, HDL, LDLCALC, TRIG, CHOLHDL, LDLDIRECT in the last 72 hours. Thyroid Function Tests: No results for input(s): TSH, T4TOTAL, FREET4, T3FREE, THYROIDAB in the last 72 hours. Anemia Panel: Recent Labs    10/15/19 0220 10/16/19 0500  FERRITIN 264 267   Sepsis Labs: Recent Labs  Lab 10/13/19 0911 10/13/19 1105  PROCALCITON <0.10  --   LATICACIDVEN 2.1* 1.3    Recent Results (from the past 240 hour(s))  Blood Culture (routine x 2)     Status: None (Preliminary result)   Collection Time: 10/13/19  9:11 AM   Specimen: BLOOD RIGHT ARM  Result Value Ref Range Status   Specimen Description BLOOD RIGHT ARM DRAWN BY RN  Final   Special Requests   Final    BOTTLES DRAWN AEROBIC AND ANAEROBIC Blood Culture results may not be optimal due to an inadequate volume of blood received in culture bottles   Culture   Final    NO GROWTH 3 DAYS Performed at [facility], [address] [place], [place] [address]    Report Status PENDING  Incomplete  Blood Culture  (routine x 2)     Status: None (Preliminary result)   Collection Time: 10/13/19  9:16 AM   Specimen: BLOOD RIGHT HAND  Result Value Ref Range Status   Specimen Description BLOOD RIGHT HAND DRAWN BY RN  Final   Special Requests   Final    BOTTLES DRAWN AEROBIC AND ANAEROBIC Blood Culture results may not be optimal due to an inadequate volume of blood received in culture bottles   Culture   Final    NO GROWTH 3 DAYS Performed at [facility], [address] [place], [place] [address]    Report Status PENDING  Incomplete         Radiology Studies: No results found.      Scheduled Meds: . enoxaparin (LOVENOX) injection  40 mg Subcutaneous BID  . insulin aspart  0-5 Units Subcutaneous QHS  . insulin aspart  0-9 Units Subcutaneous TID WC  . insulin detemir  0.075 Units/kg Subcutaneous BID  . linagliptin  5 mg Oral Daily  . methylPREDNISolone (SOLU-MEDROL) injection  50 mg Intravenous Q12H  . pantoprazole  40 mg Oral Daily  . simvastatin  20 mg Oral QPM   Continuous Infusions:    LOS: 3 days    Time spent: over 30 min    Fayrene Helper, MD [facility] Pager AMION  If 7PM-7AM, please contact night-coverage [email or web address] Password [facility] 10/16/2019, 5:37 PM

## 2019-10-16 NOTE — Progress Notes (Signed)
Spoke with U/S who stated patient needs to be NPO for @ least 6hrs prior to test. Notified service d/t will probably not be done until Saturday and stated "ok" when notified. Patient to be NPO @ 0000 tomorrow HS.

## 2019-10-16 NOTE — Plan of Care (Signed)
Patient currently no longer using NRB with [place]. SPO2 90% or > with [place] only @ 10LPM. Patient cnts to utilize deep breathing exercises. Remdesivir held today per service orders d/t elevated LFT's. Patient has been without issues or c/o this shift. Call light in reach. Will cnt to monitor.  Problem: Respiratory: Goal: Will maintain a patent airway Outcome: Progressing Goal: Complications related to the disease process, condition or treatment will be avoided or minimized Outcome: Progressing

## 2019-10-16 NOTE — TOC Initial Note (Signed)
Transition of Care [facility]) - Initial/Assessment Note    Patient Details  Name: Eduardo Dunn MRN: [id number] Date of Birth: [birth year]  [facility]) CM/SW Contact:    Midge Minium MSN, RN, NCM-BC, ACM-RN [phone] Phone Number: 10/16/2019, 11:47 AM  Clinical Narrative:                 CM following for transitional needs. Patient is a 63 y.o.malewith medical history ofhypertension and hyperlipidemia presenting with worsening fever and shortness of breath for approximately 3 days; COVID + on 12/12. Patient remains acutely ill requiring HFNC. CM team will continue to follow for POC needs.    Expected Discharge Plan: [place] Barriers to Discharge: Continued Medical Work up   Patient Goals and CMS Choice        Expected Discharge Plan and Services Expected Discharge Plan: [place]   Discharge Planning Services: CM Consult   Living arrangements for the past 2 months: Single Family Home                  Prior Living Arrangements/Services Living arrangements for the past 2 months: Single Family Home Lives with:: Self, Spouse    Activities of Daily Living Home Assistive Devices/Equipment: None ADL Screening (condition at time of admission) Patient's cognitive ability adequate to safely complete daily activities?: Yes Is the patient deaf or have difficulty hearing?: No Does the patient have difficulty seeing, even when wearing glasses/contacts?: No Does the patient have difficulty concentrating, remembering, or making decisions?: No Patient able to express need for assistance with ADLs?: Yes Does the patient have difficulty dressing or bathing?: No Independently performs ADLs?: Yes (appropriate for developmental age) Does the patient have difficulty walking or climbing stairs?: No Weakness of Legs: None Weakness of Arms/Hands: None   Admission diagnosis:  SOB (shortness of breath) [R06.02] Hypoxia [R09.02] Pneumonia  due to COVID-19 virus [U07.1, J12.89] Acute respiratory disease due to COVID-19 virus [U07.1, J06.9] Patient Active Problem List   Diagnosis Date Noted  . Pneumonia due to COVID-19 virus 10/13/2019  . Acute respiratory failure with hypoxia ([place]) 10/13/2019   PCP:  Lucia Gaskins, MD Pharmacy:   [facility], [place] [address] Phone: [phone] Fax: [phone]     Social Determinants of Health (SDOH) Interventions    Readmission Risk Interventions No flowsheet data found.

## 2019-10-17 LAB — CBC WITH DIFFERENTIAL/PLATELET
Abs Immature Granulocytes: 0.42 10*3/uL — ABNORMAL HIGH (ref 0.00–0.07)
Basophils Absolute: 0 10*3/uL (ref 0.0–0.1)
Basophils Relative: 0 %
Eosinophils Absolute: 0 10*3/uL (ref 0.0–0.5)
Eosinophils Relative: 0 %
HCT: 45.1 % (ref 39.0–52.0)
Hemoglobin: 14.6 g/dL (ref 13.0–17.0)
Immature Granulocytes: 2 %
Lymphocytes Relative: 3 %
Lymphs Abs: 0.8 10*3/uL (ref 0.7–4.0)
MCH: 29.9 pg (ref 26.0–34.0)
MCHC: 32.4 g/dL (ref 30.0–36.0)
MCV: 92.2 fL (ref 80.0–100.0)
Monocytes Absolute: 0.3 10*3/uL (ref 0.1–1.0)
Monocytes Relative: 1 %
Neutro Abs: 20.8 10*3/uL — ABNORMAL HIGH (ref 1.7–7.7)
Neutrophils Relative %: 94 %
Platelets: 426 10*3/uL — ABNORMAL HIGH (ref 150–400)
RBC: 4.89 MIL/uL (ref 4.22–5.81)
RDW: 13.2 % (ref 11.5–15.5)
WBC: 22.4 10*3/uL — ABNORMAL HIGH (ref 4.0–10.5)
nRBC: 0 % (ref 0.0–0.2)

## 2019-10-17 LAB — GLUCOSE, CAPILLARY
Glucose-Capillary: 133 mg/dL — ABNORMAL HIGH (ref 70–99)
Glucose-Capillary: 150 mg/dL — ABNORMAL HIGH (ref 70–99)
Glucose-Capillary: 176 mg/dL — ABNORMAL HIGH (ref 70–99)

## 2019-10-17 LAB — COMPREHENSIVE METABOLIC PANEL
ALT: 247 U/L — ABNORMAL HIGH (ref 0–44)
AST: 67 U/L — ABNORMAL HIGH (ref 15–41)
Albumin: 2.8 g/dL — ABNORMAL LOW (ref 3.5–5.0)
Alkaline Phosphatase: 74 U/L (ref 38–126)
Anion gap: 10 (ref 5–15)
BUN: 34 mg/dL — ABNORMAL HIGH (ref 8–23)
CO2: 24 mmol/L (ref 22–32)
Calcium: 8.3 mg/dL — ABNORMAL LOW (ref 8.9–10.3)
Chloride: 105 mmol/L (ref 98–111)
Creatinine, Ser: 0.66 mg/dL (ref 0.61–1.24)
GFR calc Af Amer: >60 mL/min (ref >60)
GFR calc non Af Amer: >60 mL/min (ref >60)
Glucose, Bld: 138 mg/dL — ABNORMAL HIGH (ref 70–99)
Potassium: 4.9 mmol/L (ref 3.5–5.1)
Sodium: 139 mmol/L (ref 135–145)
Total Bilirubin: 1 mg/dL (ref 0.3–1.2)
Total Protein: 6.2 g/dL — ABNORMAL LOW (ref 6.5–8.1)

## 2019-10-17 LAB — MAGNESIUM: Magnesium: 2.3 mg/dL (ref 1.7–2.4)

## 2019-10-17 LAB — C-REACTIVE PROTEIN: CRP: 0.6 mg/dL (ref <1.0)

## 2019-10-17 LAB — D-DIMER, QUANTITATIVE: D-Dimer, Quant: 2.17 ug/mL-FEU — ABNORMAL HIGH (ref 0.00–0.50)

## 2019-10-17 LAB — FERRITIN: Ferritin: 248 ng/mL (ref 24–336)

## 2019-10-17 LAB — PHOSPHORUS: Phosphorus: 4.3 mg/dL (ref 2.5–4.6)

## 2019-10-17 NOTE — Progress Notes (Signed)
Wife Deanna called and updated. Questions answered.

## 2019-10-17 NOTE — Plan of Care (Signed)
  Problem: Education: Goal: Knowledge of risk factors and measures for prevention of condition will improve Outcome: Progressing   Problem: Respiratory: Goal: Will maintain a patent airway Outcome: Progressing Goal: Complications related to the disease process, condition or treatment will be avoided or minimized Outcome: Progressing   

## 2019-10-17 NOTE — Progress Notes (Signed)
PROGRESS NOTE    Eduardo Dunn  [id number] DOB: 08/23/1956 DOA: 10/13/2019 PCP: Lucia Gaskins, MD  Brief Narrative:  Eduardo Dunn is Eduardo Dunn 63 y.o. male with medical history of hypertension and hyperlipidemia presenting with worsening fever and shortness of breath for approximately 3 days.  The patient actually had been having fevers for approximately 10 days.  He was initially tested positive for COVID-19 on 10/04/2019.  He was having fevers up to 102.5 F at that time with Eduardo Dunn nonproductive cough.  He placed himself in self quarantine after the positive Covid test.  He was not particularly short of breath when he was initially tested.  However over the past 3 days, he has noted increasing shortness of breath, but denies any chest pain, headache, hemoptysis, nausea, vomiting, diarrhea, abdominal pain, dysuria, hematuria.  He has had Eduardo Dunn nonproductive cough occasionally bringing up some yellow sputum.  He denies any rashes or joint synovitis.  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  Because of worsening shortness of breath, he came to emergency department for further evaluation.  EMS initially noted the patient to be hypoxemic with oxygen saturation 73% on room air.  He was initially placed on 4 L with oxygen saturations in the mid 80s. In the emergency department, the patient remained hypoxemic and was placed on nonrebreather.  He was afebrile hemodynamically stable.  Chest x-ray shows bilateral patchy infiltrates.  oxygen saturation is 92-94% on nonrebreather.  He has not had any increased work of breathing.  BMP was unremarkable except for potassium 3.2.  LFTs were unremarkable.  WBC was 17.2 with platelets 425,000.  CRP was 8.1.  Procalcitonin <0.10.  The patient was started on dexamethasone and remdesivir.  Assessment & Plan:   Active Problems:   Pneumonia due to COVID-19 virus   Acute respiratory failure with hypoxia (HCC)  Acute respiratory failure with hypoxia secondary to COVID-19  pneumonia -While at bedside, weaned to 10 L HFNC, he desatted to ~85-86% slowly -> bumped back up to 15 L HFNC -Continue steroids (day 5), will hold remdesivir and follow LFT's.  If LFT's continue to improve tomorrow, will resume remdesivir. -s/p actemra on 12/21.  He declined plasma. -I/O, daily weights -prone as able, IS, OOB -inflammatory labs -12/21 CXR with patchy bilateral airspace disease - D dimer rising, follow LE [place], he's on BID lovenox for his critical illness  COVID-19 Labs  Recent Labs    10/15/19 0220 10/16/19 0500 10/17/19 0220  DDIMER 1.72* 1.79* 2.17*  FERRITIN 264 267 248  CRP 1.7* 0.9 0.6    No results found for: SARSCOV2NAA  T2DM: continue levemir and SSI, add tradjenta.  BG's reasonable, follow.  Essential hypertension -Holding lisinopril -Blood pressures well controlled -Monitor clinically  Hyperlipidemia -Continue statin  Hypokalemia -replace and follow  Elevated LFT's: follow acute hepatitis panel, RUQ [place] - slightly improved today, follow  DVT prophylaxis: BID lovenox given critical illness Code Status: full  Family Communication: none at bedside - discussed with with wife. Called 12/24. Disposition Plan: pending further improvement   Consultants:   none  Procedures:   none  Antimicrobials Anti-infectives (From admission, onward)   Start     Dose/Rate Route Frequency Ordered Stop   10/16/19 1000  remdesivir 100 mg in sodium chloride 0.9 % 100 mL IVPB  Status:  Discontinued     100 mg 200 mL/hr over 30 Minutes Intravenous Daily 10/15/19 1405 10/15/19 1415   10/16/19 1000  remdesivir 100 mg in sodium chloride  0.9 % 100 mL IVPB  Status:  Discontinued     100 mg 200 mL/hr over 30 Minutes Intravenous Daily 10/15/19 1417 10/16/19 0802   10/15/19 1415  remdesivir 200 mg in sodium chloride 0.9% 250 mL IVPB  Status:  Discontinued     200 mg 580 mL/hr over 30 Minutes Intravenous Once 10/15/19 1405 10/15/19 1415   10/14/19 1000   remdesivir 100 mg in sodium chloride 0.9 % 100 mL IVPB  Status:  Discontinued     100 mg 200 mL/hr over 30 Minutes Intravenous Daily 10/13/19 1028 10/15/19 1921   10/13/19 1130  remdesivir 200 mg in sodium chloride 0.9% 250 mL IVPB     200 mg 580 mL/hr over 30 Minutes Intravenous Once 10/13/19 1028 10/13/19 1240     Subjective: No new complaints Asking about Eduardo Dunn timeline  Objective: Vitals:   10/17/19 0000 10/17/19 0742 10/17/19 0858 10/17/19 1103  BP: 130/79 124/80  118/69  Pulse: 70 71  75  Resp: 19 19  16   Temp: 98.5 F (36.9 C) 98.5 F (36.9 C)  98.1 F (36.7 C)  TempSrc: Oral Oral  Oral  SpO2: 90% 93% 92% (!) 86%  Weight:      Height:        Intake/Output Summary (Last 24 hours) at 10/17/2019 1438 Last data filed at 10/17/2019 1300 Gross per 24 hour  Intake 462 ml  Output 1300 ml  Net -838 ml   Filed Weights   10/13/19 0853  Weight: 106.6 kg    Examination:  General: No acute distress. Cardiovascular: RRR Lungs: Clear to auscultation bilaterally  Abdomen: Soft, nontender, nondistended Neurological: Alert and oriented 3. Moves all extremities 4. Cranial nerves II through XII grossly intact. Skin: Warm and dry. No rashes or lesions. Extremities: No clubbing or cyanosis. No edema.   Data Reviewed: I have personally reviewed following labs and imaging studies  CBC: Recent Labs  Lab 10/13/19 0911 10/14/19 0439 10/15/19 0220 10/16/19 0500 10/17/19 0220  WBC 17.2* 17.8* 20.5* 22.4* 22.4*  NEUTROABS 14.1* 15.0* 18.3* 20.6* 20.8*  HGB 14.3 13.5 13.4 13.9 14.6  HCT 44.3 42.6 41.9 43.0 45.1  MCV 92.1 92.4 92.1 91.3 92.2  PLT 425* 424* 416* 432* 123XX123*   Basic Metabolic Panel: Recent Labs  Lab 10/13/19 0911 10/13/19 1105 10/14/19 0439 10/15/19 0220 10/16/19 0500 10/17/19 0220  NA 135  --  139 139 138 139  K 3.2*  --  4.6 4.8 4.7 4.9  CL 100  --  104 107 104 105  CO2 21*  --  24 23 23 24   GLUCOSE 137*  --  126* 150* 166* 138*  BUN 23  --  32* 36*  32* 34*  CREATININE 0.81  --  0.86 0.70 0.71 0.66  CALCIUM 7.7*  --  8.1* 8.2* 8.3* 8.3*  MG  --  2.5* 2.8* 2.4 2.3 2.3  PHOS  --   --  5.1* 4.8* 4.3 4.3   GFR: Estimated Creatinine Clearance: 112.9 mL/min (by C-G formula based on SCr of 0.66 mg/dL). Liver Function Tests: Recent Labs  Lab 10/13/19 0911 10/14/19 0439 10/15/19 0220 10/16/19 0500 10/17/19 0220  AST 33 38 35 143* 67*  ALT 26 38 49* 260* 247*  ALKPHOS 68 68 63 76 74  BILITOT 0.7 0.7 0.8 0.7 1.0  PROT 6.9 6.7 6.3* 6.0* 6.2*  ALBUMIN 2.8* 2.7* 2.7* 2.7* 2.8*   No results for input(s): LIPASE, AMYLASE in the last 168 hours. No results for input(s):  AMMONIA in the last 168 hours. Coagulation Profile: No results for input(s): INR, PROTIME in the last 168 hours. Cardiac Enzymes: No results for input(s): CKTOTAL, CKMB, CKMBINDEX, TROPONINI in the last 168 hours. BNP (last 3 results) No results for input(s): PROBNP in the last 8760 hours. HbA1C: No results for input(s): HGBA1C in the last 72 hours. CBG: Recent Labs  Lab 10/16/19 0759 10/16/19 1715 10/16/19 2012 10/17/19 0740 10/17/19 1238  GLUCAP 164* 147* 183* 133* 150*   Lipid Profile: No results for input(s): CHOL, HDL, LDLCALC, TRIG, CHOLHDL, LDLDIRECT in the last 72 hours. Thyroid Function Tests: No results for input(s): TSH, T4TOTAL, FREET4, T3FREE, THYROIDAB in the last 72 hours. Anemia Panel: Recent Labs    10/16/19 0500 10/17/19 0220  FERRITIN 267 248   Sepsis Labs: Recent Labs  Lab 10/13/19 0911 10/13/19 1105  PROCALCITON <0.10  --   LATICACIDVEN 2.1* 1.3    Recent Results (from the past 240 hour(s))  Blood Culture (routine x 2)     Status: None (Preliminary result)   Collection Time: 10/13/19  9:11 AM   Specimen: BLOOD RIGHT ARM  Result Value Ref Range Status   Specimen Description BLOOD RIGHT ARM DRAWN BY RN  Final   Special Requests   Final    BOTTLES DRAWN AEROBIC AND ANAEROBIC Blood Culture results may not be optimal due to an  inadequate volume of blood received in culture bottles   Culture   Final    NO GROWTH 4 DAYS Performed at [facility], [address] [place], [place] [address]    Report Status PENDING  Incomplete  Blood Culture (routine x 2)     Status: None (Preliminary result)   Collection Time: 10/13/19  9:16 AM   Specimen: BLOOD RIGHT HAND  Result Value Ref Range Status   Specimen Description BLOOD RIGHT HAND DRAWN BY RN  Final   Special Requests   Final    BOTTLES DRAWN AEROBIC AND ANAEROBIC Blood Culture results may not be optimal due to an inadequate volume of blood received in culture bottles   Culture   Final    NO GROWTH 4 DAYS Performed at [facility], [address], [place], [place] [address]    Report Status PENDING  Incomplete         Radiology Studies: No results found.      Scheduled Meds: . enoxaparin (LOVENOX) injection  40 mg Subcutaneous BID  . insulin aspart  0-5 Units Subcutaneous QHS  . insulin aspart  0-9 Units Subcutaneous TID WC  . insulin detemir  0.075 Units/kg Subcutaneous BID  . linagliptin  5 mg Oral Daily  . methylPREDNISolone (SOLU-MEDROL) injection  50 mg Intravenous Q12H  . pantoprazole  40 mg Oral Daily  . simvastatin  20 mg Oral QPM   Continuous Infusions:    LOS: 4 days    Time spent: over 30 min    Fayrene Helper, MD [facility] Pager AMION  If 7PM-7AM, please contact night-coverage [email or web address] Password [facility] 10/17/2019, 2:38 PM

## 2019-10-18 ENCOUNTER — Inpatient Hospital Stay (HOSPITAL_COMMUNITY): Payer: BLUE CROSS/BLUE SHIELD

## 2019-10-18 DIAGNOSIS — R7989 Other specified abnormal findings of blood chemistry: Secondary | ICD-10-CM

## 2019-10-18 LAB — COMPREHENSIVE METABOLIC PANEL
ALT: 170 U/L — ABNORMAL HIGH (ref 0–44)
AST: 32 U/L (ref 15–41)
Albumin: 2.5 g/dL — ABNORMAL LOW (ref 3.5–5.0)
Alkaline Phosphatase: 78 U/L (ref 38–126)
Anion gap: 11 (ref 5–15)
BUN: 31 mg/dL — ABNORMAL HIGH (ref 8–23)
CO2: 23 mmol/L (ref 22–32)
Calcium: 8.1 mg/dL — ABNORMAL LOW (ref 8.9–10.3)
Chloride: 102 mmol/L (ref 98–111)
Creatinine, Ser: 0.72 mg/dL (ref 0.61–1.24)
GFR calc Af Amer: >60 mL/min (ref >60)
GFR calc non Af Amer: >60 mL/min (ref >60)
Glucose, Bld: 139 mg/dL — ABNORMAL HIGH (ref 70–99)
Potassium: 4.9 mmol/L (ref 3.5–5.1)
Sodium: 136 mmol/L (ref 135–145)
Total Bilirubin: 0.5 mg/dL (ref 0.3–1.2)
Total Protein: 5.5 g/dL — ABNORMAL LOW (ref 6.5–8.1)

## 2019-10-18 LAB — LIPID PANEL
Cholesterol: 170 mg/dL (ref 0–200)
HDL: 20 mg/dL — ABNORMAL LOW (ref >40)
LDL Cholesterol: 128 mg/dL — ABNORMAL HIGH (ref 0–99)
Total CHOL/HDL Ratio: 8.5 RATIO
Triglycerides: 110 mg/dL (ref <150)
VLDL: 22 mg/dL (ref 0–40)

## 2019-10-18 LAB — GLUCOSE, CAPILLARY
Glucose-Capillary: 116 mg/dL — ABNORMAL HIGH (ref 70–99)
Glucose-Capillary: 203 mg/dL — ABNORMAL HIGH (ref 70–99)
Glucose-Capillary: 162 mg/dL — ABNORMAL HIGH (ref 70–99)
Glucose-Capillary: 142 mg/dL — ABNORMAL HIGH (ref 70–99)

## 2019-10-18 LAB — CBC WITH DIFFERENTIAL/PLATELET
Abs Immature Granulocytes: 0.25 10*3/uL — ABNORMAL HIGH (ref 0.00–0.07)
Basophils Absolute: 0 10*3/uL (ref 0.0–0.1)
Basophils Relative: 0 %
Eosinophils Absolute: 0 10*3/uL (ref 0.0–0.5)
Eosinophils Relative: 0 %
HCT: 43.1 % (ref 39.0–52.0)
Hemoglobin: 14 g/dL (ref 13.0–17.0)
Immature Granulocytes: 1 %
Lymphocytes Relative: 3 %
Lymphs Abs: 0.7 10*3/uL (ref 0.7–4.0)
MCH: 29.3 pg (ref 26.0–34.0)
MCHC: 32.5 g/dL (ref 30.0–36.0)
MCV: 90.2 fL (ref 80.0–100.0)
Monocytes Absolute: 0.3 10*3/uL (ref 0.1–1.0)
Monocytes Relative: 2 %
Neutro Abs: 19.5 10*3/uL — ABNORMAL HIGH (ref 1.7–7.7)
Neutrophils Relative %: 94 %
Platelets: 411 10*3/uL — ABNORMAL HIGH (ref 150–400)
RBC: 4.78 MIL/uL (ref 4.22–5.81)
RDW: 13.2 % (ref 11.5–15.5)
WBC: 20.8 10*3/uL — ABNORMAL HIGH (ref 4.0–10.5)
nRBC: 0 % (ref 0.0–0.2)

## 2019-10-18 LAB — PHOSPHORUS: Phosphorus: 4.1 mg/dL (ref 2.5–4.6)

## 2019-10-18 LAB — CULTURE, BLOOD (ROUTINE X 2)
Culture: NO GROWTH
Culture: NO GROWTH

## 2019-10-18 LAB — D-DIMER, QUANTITATIVE: D-Dimer, Quant: 1.73 ug/mL-FEU — ABNORMAL HIGH (ref 0.00–0.50)

## 2019-10-18 LAB — C-REACTIVE PROTEIN: CRP: 0.6 mg/dL (ref <1.0)

## 2019-10-18 LAB — FERRITIN: Ferritin: 193 ng/mL (ref 24–336)

## 2019-10-18 LAB — MAGNESIUM: Magnesium: 2.2 mg/dL (ref 1.7–2.4)

## 2019-10-18 MED ORDER — SODIUM CHLORIDE 0.9 % IV SOLN
100.0000 mg | INTRAVENOUS | Status: AC
  Administered 2019-10-18 – 2019-10-19 (×2): 100 mg via INTRAVENOUS
  Filled 2019-10-18 (×2): qty 20

## 2019-10-18 NOTE — Progress Notes (Signed)
VASCULAR LAB PRELIMINARY  PRELIMINARY  PRELIMINARY  PRELIMINARY  Bilateral lower extremity venous duplex completed.    Preliminary report:  See CV proc for preliminary results.  Jashae Wiggs, RVT 10/18/2019, 3:40 PM

## 2019-10-18 NOTE — Consult Note (Signed)
Remdesivir - Pharmacy Brief Note   O:  ALT: 26-->260-->170 U/L CXR: Patchy bilateral airspace disease consistent with pneumonia SpO2: 85% on 15L HFNC   A/P:   Remdesivir 200 mg IVPB once followed by 100 mg IVPB daily x 4 days total   The patient has received 3 total doses but treatment was interrupted due to elevated LFTs  Restart remdesivir 100 mg IVPB daily x 2 more days  F/U ALT in am: continue completion of treatment as long as ALT > 220 U/L  Vallery Sa, PharmD 10/18/2019 3:24 PM

## 2019-10-18 NOTE — Evaluation (Signed)
Physical Therapy Evaluation Patient Details Name: Eduardo Dunn MRN: [id number] DOB: 16-Feb-1956 Today's Date: 10/18/2019   History of Present Illness  63 y.o. male with medical history of hypertension and hyperlipidemia presenting with worsening fever and shortness of breath for approximately 3 days.  The patient actually had been having fevers for approximately 10 days.  He was initially tested positive for COVID-19 on 10/04/2019.  He was having fevers up to 102.5 F at that time with a nonproductive cough.  He placed himself in self quarantine after the positive Covid test.  Clinical Impression   Pt admitted with above diagnosis. 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 Pt currently with functional limitations due to the deficits listed below (see PT Problem List). This pm pt is doing well with mobility he is able to get to EOB with SBA and set up, able to sit unsupported EOB w/o LOB, sit<>stand w/ min guard assist and ambulated approx 67ft in room with min guard assist. At start of session pt was on 6L/min via HFNC and was satting in 90s, with supine to sit pt desat to 83% needing cues and flutter valve use to increase sats to high 80s, pt able to ambulate on 8L/min via HFNC and noted min desat 81% pt took increased time approx 9mins to recover to stable 89%. Pt will benefit from skilled PT to increase his overall strength, balance and coordination, activity tolerance, independence and safety with mobility to allow discharge to the venue listed below.       Follow Up Recommendations Home health PT    Equipment Recommendations  None recommended by PT    Recommendations for Other Services OT consult     Precautions / Restrictions Precautions Precautions: Fall Precaution Comments: desat with mobility Restrictions Weight Bearing Restrictions: No      Mobility  Bed Mobility Overal bed mobility: Needs Assistance Bed  Mobility: Supine to Sit;Sit to Supine     Supine to sit: Supervision Sit to supine: Supervision      Transfers Overall transfer level: Needs assistance Equipment used: None Transfers: Sit to/from Stand Sit to Stand: Min guard            Ambulation/Gait Ambulation/Gait assistance: Min guard Gait Distance (Feet): 30 Feet Assistive device: 1 person hand held assist Gait Pattern/deviations: Step-through pattern Gait velocity: slow   General Gait Details: desat to min 81% on on  8L/min  Stairs            Wheelchair Mobility    Modified Rankin (Stroke Patients Only)       Balance Overall balance assessment: Mild deficits observed, not formally tested                                           Pertinent Vitals/Pain Pain Assessment: No/denies pain    Home Living Family/patient expects to be discharged to:: Private residence Living Arrangements: Spouse/significant other;Children Available Help at Discharge: Family Type of Home: House Home Access: Stairs to enter   Technical brewer of Steps: 2 Home Layout: One level Home Equipment: Environmental consultant - 2 wheels;Bedside commode      Prior Function Level of Independence: Independent               Hand Dominance   Dominant Hand: Left    Extremity/Trunk Assessment   Upper  Extremity Assessment Upper Extremity Assessment: Generalized weakness    Lower Extremity Assessment Lower Extremity Assessment: Generalized weakness    Cervical / Trunk Assessment Cervical / Trunk Assessment: Normal  Communication   Communication: No difficulties  Cognition Arousal/Alertness: Awake/alert Behavior During Therapy: WFL for tasks assessed/performed Overall Cognitive Status: Within Functional Limits for tasks assessed                                        General Comments General comments (skin integrity, edema, etc.): Pt states he has been decreased today from 10L/min HFNC to  6L/min HFNC, with ambulation ambulated on 8L/min via HFNC and desat to min 81% cues needed for pursed lip breathing and also use of flutter valve to recover to 86%    Exercises Other Exercises Other Exercises: reinforced use of flutter valve to increase 02 sats Other Exercises: reinforced use of IS   Assessment/Plan    PT Assessment Patient needs continued PT services  PT Problem List Decreased strength;Decreased activity tolerance;Decreased balance;Decreased mobility;Decreased coordination;Decreased safety awareness       PT Treatment Interventions Gait training;Functional mobility training;Therapeutic activities;Therapeutic exercise;Balance training;Neuromuscular re-education;Patient/family education    PT Goals (Current goals can be found in the Care Plan section)  Acute Rehab PT Goals Patient Stated Goal: get better and go home PT Goal Formulation: With patient Time For Goal Achievement: 11/01/19 Potential to Achieve Goals: Good    Frequency Min 3X/week   Barriers to discharge        Co-evaluation               AM-PAC PT "6 Clicks" Mobility  Outcome Measure Help needed turning from your back to your side while in a flat bed without using bedrails?: A Little Help needed moving from lying on your back to sitting on the side of a flat bed without using bedrails?: A Little Help needed moving to and from a bed to a chair (including a wheelchair)?: A Little Help needed standing up from a chair using your arms (e.g., wheelchair or bedside chair)?: A Little Help needed to walk in hospital room?: A Little Help needed climbing 3-5 steps with a railing? : A Lot 6 Click Score: 17    End of Session Equipment Utilized During Treatment: Oxygen Activity Tolerance: Treatment limited secondary to medical complications (Comment);Patient tolerated treatment well Patient left: in bed;with call bell/phone within reach Nurse Communication: Mobility status PT Visit Diagnosis: Other  abnormalities of gait and mobility (R26.89);Muscle weakness (generalized) (M62.81)    Time: [id number] PT Time Calculation (min) (ACUTE ONLY): 28 min   Charges:   PT Evaluation $PT Eval Moderate Complexity: 1 Mod PT Treatments $Therapeutic Activity: 8-22 mins        Horald Chestnut, PT   Delford Field 10/18/2019, 4:00 PM

## 2019-10-18 NOTE — Progress Notes (Signed)
PROGRESS NOTE    GORDON TRISLER  [id number] DOB: 10-10-1956 DOA: 10/13/2019 PCP: Lucia Gaskins, MD  Brief Narrative:  MALCOLM STANHOPE is Sarinah Doetsch 63 y.o. male with medical history of hypertension and hyperlipidemia presenting with worsening fever and shortness of breath for approximately 3 days.  The patient actually had been having fevers for approximately 10 days.  He was initially tested positive for COVID-19 on 10/04/2019.  He was having fevers up to 102.5 F at that time with Shia Eber nonproductive cough.  He placed himself in self quarantine after the positive Covid test.  He was not particularly short of breath when he was initially tested.  However over the past 3 days, he has noted increasing shortness of breath, but denies any chest pain, headache, hemoptysis, nausea, vomiting, diarrhea, abdominal pain, dysuria, hematuria.  He has had Coleson Kant nonproductive cough occasionally bringing up some yellow sputum.  He denies any rashes or joint synovitis.  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  Because of worsening shortness of breath, he came to emergency department for further evaluation.  EMS initially noted the patient to be hypoxemic with oxygen saturation 73% on room air.  He was initially placed on 4 L with oxygen saturations in the mid 80s. In the emergency department, the patient remained hypoxemic and was placed on nonrebreather.  He was afebrile hemodynamically stable.  Chest x-ray shows bilateral patchy infiltrates.  oxygen saturation is 92-94% on nonrebreather.  He has not had any increased work of breathing.  BMP was unremarkable except for potassium 3.2.  LFTs were unremarkable.  WBC was 17.2 with platelets 425,000.  CRP was 8.1.  Procalcitonin <0.10.  The patient was started on dexamethasone and remdesivir.  Assessment & Plan:   Active Problems:   Pneumonia due to COVID-19 virus   Acute respiratory failure with hypoxia (HCC)  Acute respiratory failure with hypoxia secondary to COVID-19  pneumonia -Weaned pt to 6 L at bedside and he satted well in low 90's, high 80's - discussed with nursing to follow -Continue steroids (day 5), LFT's improving, will restart remdesivir -s/p actemra on 12/21.  He declined plasma. -I/O, daily weights -prone as able, IS, OOB -inflammatory labs -12/21 CXR with patchy bilateral airspace disease - D dimer rising, follow LE [place], he's on BID lovenox for his critical illness  COVID-19 Labs  Recent Labs    10/16/19 0500 10/17/19 0220 10/18/19 0227  DDIMER 1.79* 2.17* 1.73*  FERRITIN 267 248 193  CRP 0.9 0.6 0.6    No results found for: SARSCOV2NAA  T2DM: continue levemir and SSI, add tradjenta.  BG's reasonable, follow.  Essential hypertension -Holding lisinopril -Blood pressures well controlled -Monitor clinically  Hyperlipidemia -statin on hold with lfts, follow for resumption - LDL high, HDL low, consider adjustment outpatient  Hypokalemia -replace and follow  Elevated LFT's: follow acute hepatitis panel, RUQ [place] - slightly improved today, follow  DVT prophylaxis: BID lovenox given critical illness Code Status: full  Family Communication: none at bedside - discussed with with wife. Called 12/24. Disposition Plan: pending further improvement   Consultants:   none  Procedures:   none  Antimicrobials Anti-infectives (From admission, onward)   Start     Dose/Rate Route Frequency Ordered Stop   10/18/19 1800  remdesivir 100 mg in sodium chloride 0.9 % 100 mL IVPB     100 mg 200 mL/hr over 30 Minutes Intravenous Every 24 hours 10/18/19 1523 10/20/19 1759   10/16/19 1000  remdesivir 100 mg in  sodium chloride 0.9 % 100 mL IVPB  Status:  Discontinued     100 mg 200 mL/hr over 30 Minutes Intravenous Daily 10/15/19 1405 10/15/19 1415   10/16/19 1000  remdesivir 100 mg in sodium chloride 0.9 % 100 mL IVPB  Status:  Discontinued     100 mg 200 mL/hr over 30 Minutes Intravenous Daily 10/15/19 1417 10/16/19 0802   10/15/19  1415  remdesivir 200 mg in sodium chloride 0.9% 250 mL IVPB  Status:  Discontinued     200 mg 580 mL/hr over 30 Minutes Intravenous Once 10/15/19 1405 10/15/19 1415   10/14/19 1000  remdesivir 100 mg in sodium chloride 0.9 % 100 mL IVPB  Status:  Discontinued     100 mg 200 mL/hr over 30 Minutes Intravenous Daily 10/13/19 1028 10/15/19 1921   10/13/19 1130  remdesivir 200 mg in sodium chloride 0.9% 250 mL IVPB     200 mg 580 mL/hr over 30 Minutes Intravenous Once 10/13/19 1028 10/13/19 1240     Subjective: Feels ok, asking about discharge  Objective: Vitals:   10/18/19 0400 10/18/19 0750 10/18/19 0948 10/18/19 1630  BP: 116/72 124/75  127/79  Pulse: 72 68 87 77  Resp: (!) 24 14 (!) 24 19  Temp:  98.6 F (37 C)  98.5 F (36.9 C)  TempSrc:  Oral  Oral  SpO2:  93% (!) 86% 91%  Weight:      Height:        Intake/Output Summary (Last 24 hours) at 10/18/2019 1944 Last data filed at 10/18/2019 1757 Gross per 24 hour  Intake --  Output 850 ml  Net -850 ml   Filed Weights   10/13/19 0853  Weight: 106.6 kg    Examination:  General: No acute distress. Cardiovascular: RRR Lungs: Clear to auscultation bilaterally  Abdomen: Soft, nontender, nondistended  Neurological: Alert and oriented 3. Moves all extremities 4. Cranial nerves II through XII grossly intact. Skin: Warm and dry. No rashes or lesions. Extremities: No clubbing or cyanosis. No edema.  Data Reviewed: I have personally reviewed following labs and imaging studies  CBC: Recent Labs  Lab 10/14/19 0439 10/15/19 0220 10/16/19 0500 10/17/19 0220 10/18/19 0227  WBC 17.8* 20.5* 22.4* 22.4* 20.8*  NEUTROABS 15.0* 18.3* 20.6* 20.8* 19.5*  HGB 13.5 13.4 13.9 14.6 14.0  HCT 42.6 41.9 43.0 45.1 43.1  MCV 92.4 92.1 91.3 92.2 90.2  PLT 424* 416* 432* 426* 123456*   Basic Metabolic Panel: Recent Labs  Lab 10/14/19 0439 10/15/19 0220 10/16/19 0500 10/17/19 0220 10/18/19 0227  NA 139 139 138 139 136  K 4.6 4.8  4.7 4.9 4.9  CL 104 107 104 105 102  CO2 24 23 23 24 23   GLUCOSE 126* 150* 166* 138* 139*  BUN 32* 36* 32* 34* 31*  CREATININE 0.86 0.70 0.71 0.66 0.72  CALCIUM 8.1* 8.2* 8.3* 8.3* 8.1*  MG 2.8* 2.4 2.3 2.3 2.2  PHOS 5.1* 4.8* 4.3 4.3 4.1   GFR: Estimated Creatinine Clearance: 112.9 mL/min (by C-G formula based on SCr of 0.72 mg/dL). Liver Function Tests: Recent Labs  Lab 10/14/19 0439 10/15/19 0220 10/16/19 0500 10/17/19 0220 10/18/19 0227  AST 38 35 143* 67* 32  ALT 38 49* 260* 247* 170*  ALKPHOS 68 63 76 74 78  BILITOT 0.7 0.8 0.7 1.0 0.5  PROT 6.7 6.3* 6.0* 6.2* 5.5*  ALBUMIN 2.7* 2.7* 2.7* 2.8* 2.5*   No results for input(s): LIPASE, AMYLASE in the last 168 hours. No results for  input(s): AMMONIA in the last 168 hours. Coagulation Profile: No results for input(s): INR, PROTIME in the last 168 hours. Cardiac Enzymes: No results for input(s): CKTOTAL, CKMB, CKMBINDEX, TROPONINI in the last 168 hours. BNP (last 3 results) No results for input(s): PROBNP in the last 8760 hours. HbA1C: No results for input(s): HGBA1C in the last 72 hours. CBG: Recent Labs  Lab 10/17/19 1238 10/17/19 2125 10/18/19 0740 10/18/19 1135 10/18/19 1555  GLUCAP 150* 176* 116* 203* 162*   Lipid Profile: Recent Labs    10/18/19 0227  CHOL 170  HDL 20*  LDLCALC 128*  TRIG 110  CHOLHDL 8.5   Thyroid Function Tests: No results for input(s): TSH, T4TOTAL, FREET4, T3FREE, THYROIDAB in the last 72 hours. Anemia Panel: Recent Labs    10/17/19 0220 10/18/19 0227  FERRITIN 248 193   Sepsis Labs: Recent Labs  Lab 10/13/19 0911 10/13/19 1105  PROCALCITON <0.10  --   LATICACIDVEN 2.1* 1.3    Recent Results (from the past 240 hour(s))  Blood Culture (routine x 2)     Status: None   Collection Time: 10/13/19  9:11 AM   Specimen: BLOOD RIGHT ARM  Result Value Ref Range Status   Specimen Description BLOOD RIGHT ARM DRAWN BY RN  Final   Special Requests   Final    BOTTLES DRAWN  AEROBIC AND ANAEROBIC Blood Culture results may not be optimal due to an inadequate volume of blood received in culture bottles   Culture   Final    NO GROWTH 5 DAYS Performed at [facility], [address] [place], [place] [address]    Report Status 10/18/2019 FINAL  Final  Blood Culture (routine x 2)     Status: None   Collection Time: 10/13/19  9:16 AM   Specimen: BLOOD RIGHT HAND  Result Value Ref Range Status   Specimen Description BLOOD RIGHT HAND DRAWN BY RN  Final   Special Requests   Final    BOTTLES DRAWN AEROBIC AND ANAEROBIC Blood Culture results may not be optimal due to an inadequate volume of blood received in culture bottles   Culture   Final    NO GROWTH 5 DAYS Performed at [facility], [address] [place], [place] [address]    Report Status 10/18/2019 FINAL  Final         Radiology Studies: VAS [place] LOWER EXTREMITY VENOUS (DVT)  Result Date: 10/18/2019  Lower Venous Study Indications: Covid-19, D-Dimer elevated.  Comparison Study: No prior study on file for comparison. Performing Technologist: Sharion Dove RVS  Examination Guidelines: Shirleen Mcfaul complete evaluation includes B-mode imaging, spectral Doppler, color Doppler, and power Doppler as needed of all accessible portions of each vessel. Bilateral testing is considered an integral part of Marshaun Lortie complete examination. Limited examinations for reoccurring indications may be performed as noted.  +---------+---------------+---------+-----------+----------+--------------+ RIGHT    CompressibilityPhasicitySpontaneityPropertiesThrombus Aging +---------+---------------+---------+-----------+----------+--------------+ CFV      Full           Yes      Yes                                 +---------+---------------+---------+-----------+----------+--------------+ SFJ      Full                                                        +---------+---------------+---------+-----------+----------+--------------+  FV Prox   Full                                                        +---------+---------------+---------+-----------+----------+--------------+ FV Mid   Full                                                        +---------+---------------+---------+-----------+----------+--------------+ FV DistalFull                                                        +---------+---------------+---------+-----------+----------+--------------+ PFV      Full                                                        +---------+---------------+---------+-----------+----------+--------------+ POP      Full           Yes      Yes                                 +---------+---------------+---------+-----------+----------+--------------+ PTV      Full                                                        +---------+---------------+---------+-----------+----------+--------------+ PERO     Full                                                        +---------+---------------+---------+-----------+----------+--------------+   +---------+---------------+---------+-----------+----------+--------------+ LEFT     CompressibilityPhasicitySpontaneityPropertiesThrombus Aging +---------+---------------+---------+-----------+----------+--------------+ CFV      Full           Yes      Yes                                 +---------+---------------+---------+-----------+----------+--------------+ SFJ      Full                                                        +---------+---------------+---------+-----------+----------+--------------+ FV Prox  Full                                                        +---------+---------------+---------+-----------+----------+--------------+  FV Mid   Full                                                        +---------+---------------+---------+-----------+----------+--------------+ FV DistalFull                                                         +---------+---------------+---------+-----------+----------+--------------+ PFV      Full                                                        +---------+---------------+---------+-----------+----------+--------------+ POP      Full           Yes      Yes                                 +---------+---------------+---------+-----------+----------+--------------+ PTV      Full                                                        +---------+---------------+---------+-----------+----------+--------------+ PERO     Full                                                        +---------+---------------+---------+-----------+----------+--------------+     Summary: Right: There is no evidence of deep vein thrombosis in the lower extremity. Left: There is no evidence of deep vein thrombosis in the lower extremity.  *See table(s) above for measurements and observations.    Preliminary    US Abdomen Limited RUQ  Result Date: 10/18/2019 CLINICAL DATA:  COVID-19 infection and elevated liver enzymes. EXAM: ULTRASOUND ABDOMEN LIMITED RIGHT UPPER QUADRANT COMPARISON:  CT of the abdomen on 05/11/2008 FINDINGS: Gallbladder: No gallstones or wall thickening visualized. No sonographic Murphy sign noted by sonographer. Common bile duct: Diameter: 3 mm. Liver: No focal lesion identified. Within normal limits in parenchymal echogenicity. Portal vein is patent on color Doppler imaging with normal direction of blood flow towards the liver. Other: No visualized ascites. IMPRESSION: Normal right upper quadrant abdominal ultrasound. Electronically Signed   By: Aletta Edouard M.D.   On: 10/18/2019 08:52        Scheduled Meds: . enoxaparin (LOVENOX) injection  40 mg Subcutaneous BID  . insulin aspart  0-5 Units Subcutaneous QHS  . insulin aspart  0-9 Units Subcutaneous TID WC  . insulin detemir  0.075 Units/kg Subcutaneous BID  . linagliptin  5 mg Oral Daily  .  methylPREDNISolone (SOLU-MEDROL) injection  50 mg Intravenous Q12H  . pantoprazole  40 mg Oral Daily   Continuous Infusions: . remdesivir 100 mg in NS  100 mL 100 mg (10/18/19 1658)     LOS: 5 days    Time spent: over 36 min    Fayrene Helper, MD [facility] Pager AMION  If 7PM-7AM, please contact night-coverage [email or web address] Password [facility] 10/18/2019, 7:44 PM

## 2019-10-19 DIAGNOSIS — E118 Type 2 diabetes mellitus with unspecified complications: Secondary | ICD-10-CM

## 2019-10-19 DIAGNOSIS — J1289 Other viral pneumonia: Secondary | ICD-10-CM

## 2019-10-19 DIAGNOSIS — R748 Abnormal levels of other serum enzymes: Secondary | ICD-10-CM

## 2019-10-19 DIAGNOSIS — J9601 Acute respiratory failure with hypoxia: Secondary | ICD-10-CM

## 2019-10-19 DIAGNOSIS — U071 COVID-19: Principal | ICD-10-CM

## 2019-10-19 DIAGNOSIS — I10 Essential (primary) hypertension: Secondary | ICD-10-CM

## 2019-10-19 LAB — GLUCOSE, CAPILLARY
Glucose-Capillary: 93 mg/dL (ref 70–99)
Glucose-Capillary: 152 mg/dL — ABNORMAL HIGH (ref 70–99)
Glucose-Capillary: 132 mg/dL — ABNORMAL HIGH (ref 70–99)

## 2019-10-19 LAB — MAGNESIUM: Magnesium: 2.2 mg/dL (ref 1.7–2.4)

## 2019-10-19 LAB — CBC WITH DIFFERENTIAL/PLATELET
Abs Immature Granulocytes: 0.29 10*3/uL — ABNORMAL HIGH (ref 0.00–0.07)
Basophils Absolute: 0 10*3/uL (ref 0.0–0.1)
Basophils Relative: 0 %
Eosinophils Absolute: 0 10*3/uL (ref 0.0–0.5)
Eosinophils Relative: 0 %
HCT: 45.1 % (ref 39.0–52.0)
Hemoglobin: 14.6 g/dL (ref 13.0–17.0)
Immature Granulocytes: 1 %
Lymphocytes Relative: 3 %
Lymphs Abs: 0.8 10*3/uL (ref 0.7–4.0)
MCH: 29.4 pg (ref 26.0–34.0)
MCHC: 32.4 g/dL (ref 30.0–36.0)
MCV: 90.7 fL (ref 80.0–100.0)
Monocytes Absolute: 0.6 10*3/uL (ref 0.1–1.0)
Monocytes Relative: 3 %
Neutro Abs: 20.8 10*3/uL — ABNORMAL HIGH (ref 1.7–7.7)
Neutrophils Relative %: 93 %
Platelets: 411 10*3/uL — ABNORMAL HIGH (ref 150–400)
RBC: 4.97 MIL/uL (ref 4.22–5.81)
RDW: 13.2 % (ref 11.5–15.5)
WBC: 22.5 10*3/uL — ABNORMAL HIGH (ref 4.0–10.5)
nRBC: 0 % (ref 0.0–0.2)

## 2019-10-19 LAB — COMPREHENSIVE METABOLIC PANEL
ALT: 124 U/L — ABNORMAL HIGH (ref 0–44)
AST: 25 U/L (ref 15–41)
Albumin: 2.7 g/dL — ABNORMAL LOW (ref 3.5–5.0)
Alkaline Phosphatase: 71 U/L (ref 38–126)
Anion gap: 14 (ref 5–15)
BUN: 29 mg/dL — ABNORMAL HIGH (ref 8–23)
CO2: 25 mmol/L (ref 22–32)
Calcium: 7.7 mg/dL — ABNORMAL LOW (ref 8.9–10.3)
Chloride: 97 mmol/L — ABNORMAL LOW (ref 98–111)
Creatinine, Ser: 0.65 mg/dL (ref 0.61–1.24)
GFR calc Af Amer: >60 mL/min (ref >60)
GFR calc non Af Amer: >60 mL/min (ref >60)
Glucose, Bld: 114 mg/dL — ABNORMAL HIGH (ref 70–99)
Potassium: 4.7 mmol/L (ref 3.5–5.1)
Sodium: 136 mmol/L (ref 135–145)
Total Bilirubin: 1 mg/dL (ref 0.3–1.2)
Total Protein: 5.7 g/dL — ABNORMAL LOW (ref 6.5–8.1)

## 2019-10-19 LAB — PHOSPHORUS: Phosphorus: 3.7 mg/dL (ref 2.5–4.6)

## 2019-10-19 LAB — D-DIMER, QUANTITATIVE: D-Dimer, Quant: 1.74 ug/mL-FEU — ABNORMAL HIGH (ref 0.00–0.50)

## 2019-10-19 LAB — FERRITIN: Ferritin: 198 ng/mL (ref 24–336)

## 2019-10-19 LAB — C-REACTIVE PROTEIN: CRP: 0.6 mg/dL (ref <1.0)

## 2019-10-19 NOTE — Plan of Care (Signed)
Patient remains Aox4, still relying on 6-8l [place] to maintain sats >90, to complete Rem treatment today pending morning labs, discussion being had about possible d/c pending further improvement with o2 dependency, adequate urine output, vitals wnl, safety precautions maintained, continue poc Problem: Education: Goal: Knowledge of risk factors and measures for prevention of condition will improve Outcome: Progressing   Problem: Respiratory: Goal: Will maintain a patent airway Outcome: Progressing Goal: Complications related to the disease process, condition or treatment will be avoided or minimized Outcome: Progressing   Problem: Education: Goal: Knowledge of General Education information will improve Description: Including pain rating scale, medication(s)/side effects and non-pharmacologic comfort measures Outcome: Progressing   Problem: Health Behavior/Discharge Planning: Goal: Ability to manage health-related needs will improve Outcome: Progressing   Problem: Clinical Measurements: Goal: Ability to maintain clinical measurements within normal limits will improve Outcome: Progressing Goal: Will remain free from infection Outcome: Progressing Goal: Diagnostic test results will improve Outcome: Progressing Goal: Respiratory complications will improve Outcome: Progressing Goal: Cardiovascular complication will be avoided Outcome: Progressing   Problem: Activity: Goal: Risk for activity intolerance will decrease Outcome: Progressing   Problem: Nutrition: Goal: Adequate nutrition will be maintained Outcome: Progressing   Problem: Pain Managment: Goal: General experience of comfort will improve Outcome: Progressing   Problem: Safety: Goal: Ability to remain free from injury will improve Outcome: Progressing   Problem: Skin Integrity: Goal: Risk for impaired skin integrity will decrease Outcome: Progressing   Problem: Coping: Goal: Level of anxiety will decrease Outcome:  Completed/Met   Problem: Elimination: Goal: Will not experience complications related to bowel motility Outcome: Completed/Met Goal: Will not experience complications related to urinary retention Outcome: Completed/Met

## 2019-10-19 NOTE — Evaluation (Signed)
Occupational Therapy Evaluation Patient Details Name: Eduardo Dunn MRN: [id number] DOB: 05/25/1956 Today's Date: 10/19/2019    History of Present Illness 63 y.o. male with medical history of hypertension and hyperlipidemia presenting with worsening fever and shortness of breath for approximately 3 days.  The patient actually had been having fevers for approximately 10 days.  He was initially tested positive for COVID-19 on 10/04/2019.  He was having fevers up to 102.5 F at that time with a nonproductive cough.  He placed himself in self quarantine after the positive Covid test.   Clinical Impression   This 63 y/o male presents with the above. PTA pt reports independence with ADL, iADL and functional mobility. Pt presenting with generalized weakness, decreased endurance and poor respiratory status impacting his functional performance. Pt completing functional transfers using RW with overall minguard assist - light minA, requiring modA for LB ADL. Pt fatigues with minimal activity requiring intermittent rest breaks during session. Pt initially on 6L HFNC start of session, use of 8L with standing activity with lowest SpO2 noted 82% (initially measured via finger O2 probe), switched to earlobe probe end of session with O2 sats returning to >90% given seated rest and cues for deep breathing, maintaining >90% on 6L end of session. Pt motivated to return to his PLOF. He will benefit from continued acute OT services and recommend follow up [facility] services after discharge to progress pt's safety and independence with ADL and mobility. Will follow.     Follow Up Recommendations  Home health OT;Supervision/Assistance - 24 hour    Equipment Recommendations  Tub/shower seat           Precautions / Restrictions Precautions Precautions: Fall Precaution Comments: desat with mobility Restrictions Weight Bearing Restrictions: No      Mobility Bed Mobility Overal bed mobility: Needs Assistance Bed  Mobility: Supine to Sit     Supine to sit: Supervision     General bed mobility comments: for lines and safety  Transfers Overall transfer level: Needs assistance Equipment used: None;Rolling walker (2 wheeled) Transfers: Sit to/from [facility] Sit to Stand: Min guard Stand pivot transfers: Min guard;Min assist       General transfer comment: stood from EOB x2 during session; initially without RW and use of RW for increased stability during transfers, VCs for safe hand placement, light minA-minguard for transfer to recliner    Balance Overall balance assessment: Needs assistance Sitting-balance support: Feet supported Sitting balance-Leahy Scale: Good     Standing balance support: No upper extremity supported;Bilateral upper extremity supported Standing balance-Leahy Scale: Fair Standing balance comment: able to statically stand without UE support, improved stability with use of bil UE support on RW                           ADL either performed or assessed with clinical judgement   ADL Overall ADL's : Needs assistance/impaired Eating/Feeding: Modified independent;Sitting   Grooming: Oral care;Wash/dry face;Set up;Sitting Grooming Details (indicate cue type and reason): performed in sitting vs standing due to decreased activity tolerance Upper Body Bathing: Set up;Sitting   Lower Body Bathing: Minimal assistance;Sit to/from stand   Upper Body Dressing : Set up;Min guard;Sitting   Lower Body Dressing: Moderate assistance;Sit to/from stand Lower Body Dressing Details (indicate cue type and reason): pt unable to don socks today requiring assist; minguard-minA for standing balance Toilet Transfer: Min Insurance claims handler Details (indicate cue type and reason): simulated via transfer to recliner  Toileting- Clothing Manipulation and Hygiene: Minimal assistance;Sit to/from stand       Functional mobility during ADLs: Min  guard;Minimal assistance;Rolling walker General ADL Comments: pt with weakness, decreased activity tolerance and respiratory status     Vision         Perception     Praxis      Pertinent Vitals/Pain Pain Assessment: No/denies pain     Hand Dominance Left   Extremity/Trunk Assessment Upper Extremity Assessment Upper Extremity Assessment: Generalized weakness   Lower Extremity Assessment Lower Extremity Assessment: Defer to PT evaluation;Generalized weakness   Cervical / Trunk Assessment Cervical / Trunk Assessment: Normal   Communication Communication Communication: No difficulties   Cognition Arousal/Alertness: Awake/alert Behavior During Therapy: WFL for tasks assessed/performed Overall Cognitive Status: Within Functional Limits for tasks assessed                                     General Comments       Exercises Exercises: Other exercises Other Exercises Other Exercises: use of flutter valve x5 Other Exercises: use of IS with pt pulling 1500 ml Other Exercises: issued level 2 theraband and HEP Other Exercises: standing marching in place at [facility]   Shoulder Instructions      Home Living Family/patient expects to be discharged to:: Private residence Living Arrangements: Spouse/significant other;Children Available Help at Discharge: Family Type of Home: House Home Access: Stairs to enter Technical brewer of Steps: 2   Home Layout: One level     Bathroom Shower/Tub: Teacher, early years/pre: Standard Bathroom Accessibility: Yes   Home Equipment: Environmental consultant - 2 wheels;Bedside commode          Prior Functioning/Environment Level of Independence: Independent        Comments: restaurant owner        OT Problem List: Decreased strength;Decreased activity tolerance;Impaired balance (sitting and/or standing);Decreased knowledge of use of DME or AE;Cardiopulmonary status limiting activity      OT  Treatment/Interventions: Neuromuscular education;Energy conservation;DME and/or AE instruction;Therapeutic activities;Patient/family education;Balance training;Self-care/ADL training;Therapeutic exercise    OT Goals(Current goals can be found in the care plan section) Acute Rehab OT Goals Patient Stated Goal: get better and go home OT Goal Formulation: With patient Time For Goal Achievement: 11/02/19 Potential to Achieve Goals: Good  OT Frequency: Min 2X/week   Barriers to D/C:            Co-evaluation              AM-PAC OT "6 Clicks" Daily Activity     Outcome Measure Help from another person eating meals?: None Help from another person taking care of personal grooming?: A Little Help from another person toileting, which includes using toliet, bedpan, or urinal?: A Little Help from another person bathing (including washing, rinsing, drying)?: A Little Help from another person to put on and taking off regular upper body clothing?: None Help from another person to put on and taking off regular lower body clothing?: A Lot 6 Click Score: 19   End of Session Equipment Utilized During Treatment: Rolling walker;Oxygen Nurse Communication: Mobility status  Activity Tolerance: Patient tolerated treatment well Patient left: in chair;with call bell/phone within reach  OT Visit Diagnosis: Muscle weakness (generalized) (M62.81);Unsteadiness on feet (R26.81)                Time: [id number] OT Time Calculation (min): 45 min Charges:  OT General Charges $  OT Visit: 1 Visit OT Evaluation $OT Eval Moderate Complexity: 1 Mod OT Treatments $Self Care/Home Management : 23-37 mins  Lou Cal, OT Supplemental Rehabilitation Services Pager [phone] Office [phone]   Raymondo Band 10/19/2019, 2:01 PM

## 2019-10-19 NOTE — Progress Notes (Signed)
PROGRESS NOTE    Eduardo Dunn  [id number] DOB: [birth year] DOA: 10/13/2019 PCP: Lucia Gaskins, MD   Brief Narrative:  Eduardo Dunn a 63 y.o. WM PMHx HTN, HLD, diabetes type 2 controlled with complication, HTN  Presenting with worsening fever and shortness of breath for approximately 3 days. The patient actually had been having fevers for approximately 10 days. He was initially tested positive for COVID-19 on 10/04/2019. He was having fevers up to 102.5 F at that time with a nonproductive cough. He placed himself in self quarantine after the positive Covid test. He was not particularly short of breath when he was initially tested. However over the past 3 days, he has noted increasing shortness of breath, but denies any chest pain, headache, hemoptysis, nausea, vomiting, diarrhea, abdominal pain, dysuria, hematuria.He has had a nonproductive cough occasionally bringing up some yellow sputum. He denies any rashes or joint synovitis. 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 Because of worsening shortness of breath, he came to emergency department for further evaluation. EMS initially noted the patient to be hypoxemic with oxygen saturation 73% on room air. He was initially placed on 4 L with oxygen saturations in the mid 80s. In the emergency department, the patient remained hypoxemic and was placed on nonrebreather. He was afebrile hemodynamically stable. Chest x-ray shows bilateral patchy infiltrates.oxygen saturation is 92-94% on nonrebreather. He has not had any increased work of breathing. BMP was unremarkable except for potassium 3.2. LFTs were unremarkable. WBC was 17.2 with platelets 425,000. CRP was 8.1. Procalcitonin <0.10.The patient was started on dexamethasone and remdesivir.   Subjective: Last 24 hours afebrile   Assessment & Plan:   Active Problems:   Pneumonia due to COVID-19 virus   Acute respiratory failure with hypoxia (HCC)   Acute  respiratory failure with hypoxia secondary to COVID-19 pneumonia COVID-19 Labs  Recent Labs    10/17/19 0220 10/18/19 0227 10/19/19 0037  DDIMER 2.17* 1.73* 1.74*  FERRITIN 248 193 198  CRP 0.6 0.6 0.6    12/12 POC SARS coronavirus positive  -Weaned pt to 6 L at bedside and he satted well in low 90's, high 80's - discussed with nursing to follow -Continue steroids (day 5), LFT's improving, will restart remdesivir -s/p actemra on 12/21.  He declined plasma. -I/O, daily weights -prone as able, IS, OOB -inflammatory labs -12/21 CXR with patchy bilateral airspace disease - D dimer rising, follow LE [place], he's on BID lovenox for his critical illness  Diabetes type 2 controlled with complication -[id number] hemoglobin A1c= 6.7 -: continue levemir and SSI, add tradjenta.  BG's reasonable, follow.  Essential hypertension -Holding lisinopril -Blood pressures well controlled -Monitor clinically  Hyperlipidemia -statin on hold with lfts, follow for resumption - LDL high, HDL low, consider adjustment outpatient  Hypokalemia -replace and follow  Elevated LFT's:  follow acute hepatitis panel, RUQ [place] - slightly improved today, follow  DVT prophylaxis: Lovenox Code Status: Full Family Communication:  Disposition Plan:    Consultants:    Procedures/Significant Events:     I have personally reviewed and interpreted all radiology studies and my findings are as above.  VENTILATOR SETTINGS: HFNC 12/27 Flow; 6 L/min SPO2 96%   Cultures 12/12 POC SARS coronavirus positive    Antimicrobials: Anti-infectives (From admission, onward)   Start     Dose/Rate Stop   10/18/19 1800  remdesivir 100 mg in sodium chloride 0.9 % 100 mL IVPB     100 mg 200 mL/hr over 30 Minutes 10/19/19  1151   10/16/19 1000  remdesivir 100 mg in sodium chloride 0.9 % 100 mL IVPB  Status:  Discontinued     100 mg 200 mL/hr over 30 Minutes 10/15/19 1415   10/16/19 1000  remdesivir 100 mg in sodium  chloride 0.9 % 100 mL IVPB  Status:  Discontinued     100 mg 200 mL/hr over 30 Minutes 10/16/19 0802   10/15/19 1415  remdesivir 200 mg in sodium chloride 0.9% 250 mL IVPB  Status:  Discontinued     200 mg 580 mL/hr over 30 Minutes 10/15/19 1415   10/14/19 1000  remdesivir 100 mg in sodium chloride 0.9 % 100 mL IVPB  Status:  Discontinued     100 mg 200 mL/hr over 30 Minutes 10/15/19 1921   10/13/19 1130  remdesivir 200 mg in sodium chloride 0.9% 250 mL IVPB     200 mg 580 mL/hr over 30 Minutes 10/13/19 [place] / TUBES:      Continuous Infusions:   Objective: Vitals:   10/18/19 2023 10/19/19 0005 10/19/19 0441 10/19/19 0950  BP: 131/73 134/80  124/71  Pulse: 83 75  81  Resp: (!) 24 (!) 24 (!) 21 20  Temp: 98.4 F (36.9 C) 98.6 F (37 C) 98.3 F (36.8 C) (!) 97.5 F (36.4 C)  TempSrc:    Oral  SpO2: (!) 86% 90%  90%  Weight:      Height:        Intake/Output Summary (Last 24 hours) at 10/19/2019 1224 Last data filed at 10/19/2019 [id number] Gross per 24 hour  Intake 150 ml  Output 1225 ml  Net -1075 ml   Filed Weights   10/13/19 0853  Weight: 106.6 kg    Examination:  General: A/O x4, positive acute respiratory distress, Eyes: negative scleral hemorrhage, negative anisocoria, negative icterus ENT: Negative Runny nose, negative gingival bleeding, Neck:  Negative scars, masses, torticollis, lymphadenopathy, JVD Lungs: Diffuse decreased breath sounds, positive nonproductive cough with inspiration, without wheezes or crackles Cardiovascular: Regular rate and rhythm without murmur gallop or rub normal S1 and S2 Abdomen: negative abdominal pain, nondistended, positive soft, bowel sounds, no rebound, no ascites, no appreciable mass Extremities: No significant cyanosis, clubbing, or edema bilateral lower extremities Skin: Negative rashes, lesions, ulcers Psychiatric:  Negative depression, negative anxiety, negative fatigue, negative mania    Central nervous system:  Cranial nerves II through XII intact, tongue/uvula midline, all extremities muscle strength 5/5, sensation intact throughout, negative dysarthria, negative expressive aphasia, negative receptive aphasia.  .     Data Reviewed: Care during the described time interval was provided by me .  I have reviewed this patient's available data, including medical history, events of note, physical examination, and all test results as part of my evaluation.   CBC: Recent Labs  Lab 10/15/19 0220 10/16/19 0500 10/17/19 0220 10/18/19 0227 10/19/19 0037  WBC 20.5* 22.4* 22.4* 20.8* 22.5*  NEUTROABS 18.3* 20.6* 20.8* 19.5* 20.8*  HGB 13.4 13.9 14.6 14.0 14.6  HCT 41.9 43.0 45.1 43.1 45.1  MCV 92.1 91.3 92.2 90.2 90.7  PLT 416* 432* 426* 411* [id number]*   Basic Metabolic Panel: Recent Labs  Lab 10/15/19 0220 10/16/19 0500 10/17/19 0220 10/18/19 0227 10/19/19 0037  NA 139 138 139 136 136  K 4.8 4.7 4.9 4.9 4.7  CL 107 104 105 102 97*  CO2 23 23 24 23 25   GLUCOSE 150* 166* 138* 139* 114*  BUN 36*  32* 34* 31* 29*  CREATININE 0.70 0.71 0.66 0.72 0.65  CALCIUM 8.2* 8.3* 8.3* 8.1* 7.7*  MG 2.4 2.3 2.3 2.2 2.2  PHOS 4.8* 4.3 4.3 4.1 3.7   GFR: Estimated Creatinine Clearance: 112.9 mL/min (by C-G formula based on SCr of 0.65 mg/dL). Liver Function Tests: Recent Labs  Lab 10/15/19 0220 10/16/19 0500 10/17/19 0220 10/18/19 0227 10/19/19 0037  AST 35 143* 67* 32 25  ALT 49* 260* 247* 170* 124*  ALKPHOS 63 76 74 78 71  BILITOT 0.8 0.7 1.0 0.5 1.0  PROT 6.3* 6.0* 6.2* 5.5* 5.7*  ALBUMIN 2.7* 2.7* 2.8* 2.5* 2.7*   No results for input(s): LIPASE, AMYLASE in the last 168 hours. No results for input(s): AMMONIA in the last 168 hours. Coagulation Profile: No results for input(s): INR, PROTIME in the last 168 hours. Cardiac Enzymes: No results for input(s): CKTOTAL, CKMB, CKMBINDEX, TROPONINI in the last 168 hours. BNP (last 3 results) No results for input(s): PROBNP  in the last 8760 hours. HbA1C: No results for input(s): HGBA1C in the last 72 hours. CBG: Recent Labs  Lab 10/18/19 1135 10/18/19 1555 10/18/19 2153 10/19/19 0834 10/19/19 1035  GLUCAP 203* 162* 142* 93 152*   Lipid Profile: Recent Labs    10/18/19 0227  CHOL 170  HDL 20*  LDLCALC 128*  TRIG 110  CHOLHDL 8.5   Thyroid Function Tests: No results for input(s): TSH, T4TOTAL, FREET4, T3FREE, THYROIDAB in the last 72 hours. Anemia Panel: Recent Labs    10/18/19 0227 10/19/19 0037  FERRITIN 193 198   Urine analysis: No results found for: COLORURINE, APPEARANCEUR, LABSPEC, PHURINE, GLUCOSEU, HGBUR, BILIRUBINUR, KETONESUR, PROTEINUR, UROBILINOGEN, NITRITE, LEUKOCYTESUR Sepsis Labs: @LABRCNTIP (procalcitonin:4,lacticidven:4)  ) Recent Results (from the past 240 hour(s))  Blood Culture (routine x 2)     Status: None   Collection Time: 10/13/19  9:11 AM   Specimen: BLOOD RIGHT ARM  Result Value Ref Range Status   Specimen Description BLOOD RIGHT ARM DRAWN BY RN  Final   Special Requests   Final    BOTTLES DRAWN AEROBIC AND ANAEROBIC Blood Culture results may not be optimal due to an inadequate volume of blood received in culture bottles   Culture   Final    NO GROWTH 5 DAYS Performed at [facility], [address], [place], [place] [address]    Report Status 10/18/2019 FINAL  Final  Blood Culture (routine x 2)     Status: None   Collection Time: 10/13/19  9:16 AM   Specimen: BLOOD RIGHT HAND  Result Value Ref Range Status   Specimen Description BLOOD RIGHT HAND DRAWN BY RN  Final   Special Requests   Final    BOTTLES DRAWN AEROBIC AND ANAEROBIC Blood Culture results may not be optimal due to an inadequate volume of blood received in culture bottles   Culture   Final    NO GROWTH 5 DAYS Performed at [facility], [address], [place], [place] [address]    Report Status 10/18/2019 FINAL  Final         Radiology Studies: VAS [place] LOWER EXTREMITY VENOUS  (DVT)  Result Date: 10/18/2019  Lower Venous Study Indications: Covid-19, D-Dimer elevated.  Comparison Study: No prior study on file for comparison. Performing Technologist: Sharion Dove RVS  Examination Guidelines: A complete evaluation includes B-mode imaging, spectral Doppler, color Doppler, and power Doppler as needed of all accessible portions of each vessel. Bilateral testing is considered an integral part of a complete examination. Limited  examinations for reoccurring indications may be performed as noted.  +---------+---------------+---------+-----------+----------+--------------+ RIGHT    CompressibilityPhasicitySpontaneityPropertiesThrombus Aging +---------+---------------+---------+-----------+----------+--------------+ CFV      Full           Yes      Yes                                 +---------+---------------+---------+-----------+----------+--------------+ SFJ      Full                                                        +---------+---------------+---------+-----------+----------+--------------+ FV Prox  Full                                                        +---------+---------------+---------+-----------+----------+--------------+ FV Mid   Full                                                        +---------+---------------+---------+-----------+----------+--------------+ FV DistalFull                                                        +---------+---------------+---------+-----------+----------+--------------+ PFV      Full                                                        +---------+---------------+---------+-----------+----------+--------------+ POP      Full           Yes      Yes                                 +---------+---------------+---------+-----------+----------+--------------+ PTV      Full                                                         +---------+---------------+---------+-----------+----------+--------------+ PERO     Full                                                        +---------+---------------+---------+-----------+----------+--------------+   +---------+---------------+---------+-----------+----------+--------------+ LEFT     CompressibilityPhasicitySpontaneityPropertiesThrombus Aging +---------+---------------+---------+-----------+----------+--------------+ CFV      Full           Yes      Yes                                 +---------+---------------+---------+-----------+----------+--------------+  SFJ      Full                                                        +---------+---------------+---------+-----------+----------+--------------+ FV Prox  Full                                                        +---------+---------------+---------+-----------+----------+--------------+ FV Mid   Full                                                        +---------+---------------+---------+-----------+----------+--------------+ FV DistalFull                                                        +---------+---------------+---------+-----------+----------+--------------+ PFV      Full                                                        +---------+---------------+---------+-----------+----------+--------------+ POP      Full           Yes      Yes                                 +---------+---------------+---------+-----------+----------+--------------+ PTV      Full                                                        +---------+---------------+---------+-----------+----------+--------------+ PERO     Full                                                        +---------+---------------+---------+-----------+----------+--------------+     Summary: Right: There is no evidence of deep vein thrombosis in the lower extremity. Left: There is no evidence of deep  vein thrombosis in the lower extremity.  *See table(s) above for measurements and observations.    Preliminary    US Abdomen Limited RUQ  Result Date: 10/18/2019 CLINICAL DATA:  COVID-19 infection and elevated liver enzymes. EXAM: ULTRASOUND ABDOMEN LIMITED RIGHT UPPER QUADRANT COMPARISON:  CT of the abdomen on 05/11/2008 FINDINGS: Gallbladder: No gallstones or wall thickening visualized. No sonographic Murphy sign noted by sonographer. Common bile duct: Diameter: 3 mm. Liver: No focal lesion identified. Within normal limits in parenchymal echogenicity. Portal vein  is patent on color Doppler imaging with normal direction of blood flow towards the liver. Other: No visualized ascites. IMPRESSION: Normal right upper quadrant abdominal ultrasound. Electronically Signed   By: Aletta Edouard M.D.   On: 10/18/2019 08:52        Scheduled Meds: . enoxaparin (LOVENOX) injection  40 mg Subcutaneous BID  . insulin aspart  0-5 Units Subcutaneous QHS  . insulin aspart  0-9 Units Subcutaneous TID WC  . insulin detemir  0.075 Units/kg Subcutaneous BID  . linagliptin  5 mg Oral Daily  . methylPREDNISolone (SOLU-MEDROL) injection  50 mg Intravenous Q12H  . pantoprazole  40 mg Oral Daily   Continuous Infusions:   LOS: 6 days   The patient is critically ill with multiple organ systems failure and requires high complexity decision making for assessment and support, frequent evaluation and titration of therapies, application of advanced monitoring technologies and extensive interpretation of multiple databases. Critical Care Time devoted to patient care services described in this note  Time spent: 40 minutes     Elliot Simoneaux, Geraldo Docker, MD [facility] Pager [phone]  If 7PM-7AM, please contact night-coverage [email or web address] Password [facility] 10/19/2019, 12:24 PM

## 2019-10-20 LAB — COMPREHENSIVE METABOLIC PANEL
ALT: 132 U/L — ABNORMAL HIGH (ref 0–44)
AST: 30 U/L (ref 15–41)
Albumin: 2.6 g/dL — ABNORMAL LOW (ref 3.5–5.0)
Alkaline Phosphatase: 72 U/L (ref 38–126)
Anion gap: 7 (ref 5–15)
BUN: 30 mg/dL — ABNORMAL HIGH (ref 8–23)
CO2: 26 mmol/L (ref 22–32)
Calcium: 8 mg/dL — ABNORMAL LOW (ref 8.9–10.3)
Chloride: 103 mmol/L (ref 98–111)
Creatinine, Ser: 0.61 mg/dL (ref 0.61–1.24)
GFR calc Af Amer: >60 mL/min (ref >60)
GFR calc non Af Amer: >60 mL/min (ref >60)
Glucose, Bld: 127 mg/dL — ABNORMAL HIGH (ref 70–99)
Potassium: 4.8 mmol/L (ref 3.5–5.1)
Sodium: 136 mmol/L (ref 135–145)
Total Bilirubin: 1.4 mg/dL — ABNORMAL HIGH (ref 0.3–1.2)
Total Protein: 5.1 g/dL — ABNORMAL LOW (ref 6.5–8.1)

## 2019-10-20 LAB — GLUCOSE, CAPILLARY
Glucose-Capillary: 210 mg/dL — ABNORMAL HIGH (ref 70–99)
Glucose-Capillary: 206 mg/dL — ABNORMAL HIGH (ref 70–99)
Glucose-Capillary: 191 mg/dL — ABNORMAL HIGH (ref 70–99)
Glucose-Capillary: 103 mg/dL — ABNORMAL HIGH (ref 70–99)
Glucose-Capillary: 133 mg/dL — ABNORMAL HIGH (ref 70–99)
Glucose-Capillary: 208 mg/dL — ABNORMAL HIGH (ref 70–99)

## 2019-10-20 LAB — CBC WITH DIFFERENTIAL/PLATELET
Abs Immature Granulocytes: 0.2 10*3/uL — ABNORMAL HIGH (ref 0.00–0.07)
Basophils Absolute: 0 10*3/uL (ref 0.0–0.1)
Basophils Relative: 0 %
Eosinophils Absolute: 0 10*3/uL (ref 0.0–0.5)
Eosinophils Relative: 0 %
HCT: 45.3 % (ref 39.0–52.0)
Hemoglobin: 14.9 g/dL (ref 13.0–17.0)
Immature Granulocytes: 1 %
Lymphocytes Relative: 3 %
Lymphs Abs: 0.7 10*3/uL (ref 0.7–4.0)
MCH: 29.9 pg (ref 26.0–34.0)
MCHC: 32.9 g/dL (ref 30.0–36.0)
MCV: 91 fL (ref 80.0–100.0)
Monocytes Absolute: 0.3 10*3/uL (ref 0.1–1.0)
Monocytes Relative: 2 %
Neutro Abs: 18.2 10*3/uL — ABNORMAL HIGH (ref 1.7–7.7)
Neutrophils Relative %: 94 %
Platelets: 396 10*3/uL (ref 150–400)
RBC: 4.98 MIL/uL (ref 4.22–5.81)
RDW: 13.5 % (ref 11.5–15.5)
WBC: 19.4 10*3/uL — ABNORMAL HIGH (ref 4.0–10.5)
nRBC: 0 % (ref 0.0–0.2)

## 2019-10-20 LAB — FERRITIN: Ferritin: 198 ng/mL (ref 24–336)

## 2019-10-20 LAB — D-DIMER, QUANTITATIVE: D-Dimer, Quant: 1.86 ug/mL-FEU — ABNORMAL HIGH (ref 0.00–0.50)

## 2019-10-20 LAB — PHOSPHORUS: Phosphorus: 4.1 mg/dL (ref 2.5–4.6)

## 2019-10-20 LAB — C-REACTIVE PROTEIN: CRP: 0.6 mg/dL (ref <1.0)

## 2019-10-20 LAB — MAGNESIUM: Magnesium: 2.2 mg/dL (ref 1.7–2.4)

## 2019-10-20 MED ORDER — PREDNISONE 20 MG PO TABS
60.0000 mg | ORAL_TABLET | Freq: Every day | ORAL | Status: DC
  Administered 2019-10-21: 09:00:00 60 mg via ORAL
  Filled 2019-10-20: qty 3

## 2019-10-20 NOTE — Progress Notes (Signed)
Pt walked 1.5 laps around nurse's station on 4L O2, only having to stop a couple times for a break. He was able to recovery quickly when O2 sats dropped with a short break and then was able to maintain above 90% on 2L once he was back in bed.

## 2019-10-20 NOTE — Progress Notes (Addendum)
PROGRESS NOTE    Eduardo BALFANZ  [id number] DOB: [birth year] DOA: 10/13/2019 PCP: Lucia Gaskins, MD   Brief Narrative:  Eduardo Dunn a 63 y.o. WM PMHx HTN, HLD, diabetes type 2 controlled with complication, HTN  Presenting with worsening fever and shortness of breath for approximately 3 days. The patient actually had been having fevers for approximately 10 days. He was initially tested positive for COVID-19 on 10/04/2019. He was having fevers up to 102.5 F at that time with a nonproductive cough. He placed himself in self quarantine after the positive Covid test. He was not particularly short of breath when he was initially tested. However over the past 3 days, he has noted increasing shortness of breath, but denies any chest pain, headache, hemoptysis, nausea, vomiting, diarrhea, abdominal pain, dysuria, hematuria.He has had a nonproductive cough occasionally bringing up some yellow sputum. He denies any rashes or joint synovitis. 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 Because of worsening shortness of breath, he came to emergency department for further evaluation. EMS initially noted the patient to be hypoxemic with oxygen saturation 73% on room air. He was initially placed on 4 L with oxygen saturations in the mid 80s. In the emergency department, the patient remained hypoxemic and was placed on nonrebreather. He was afebrile hemodynamically stable. Chest x-ray shows bilateral patchy infiltrates.oxygen saturation is 92-94% on nonrebreather. He has not had any increased work of breathing. BMP was unremarkable except for potassium 3.2. LFTs were unremarkable. WBC was 17.2 with platelets 425,000. CRP was 8.1. Procalcitonin <0.10.The patient was started on dexamethasone and remdesivir.   Subjective: 12/28 last 24 hours afebrile   Assessment & Plan:   Active Problems:   Pneumonia due to COVID-19 virus   Acute respiratory failure with hypoxia  (HCC)   Acute respiratory failure with hypoxia secondary to COVID-19 pneumonia COVID-19 Labs  Recent Labs    10/18/19 0227 10/19/19 0037 10/20/19 0420  DDIMER 1.73* 1.74* 1.86*  FERRITIN 193 198 198  CRP 0.6 0.6 0.6    12/12 POC SARS coronavirus positive  -12/21 Actemra x1 dose -Prednisone 60 mg daily -Completed course of Remdesivir SATURATION QUALIFICATIONS: (This note is used to comply with regulatory documentation for home oxygen) Patient Saturations on Room Air at Rest = 85% Patient Saturations on Room Air while Ambulating = NA (drops at rest) Patient Saturations on 4 Liters of oxygen while Ambulating >=90% Please briefly explain why patient needs home oxygen: To maintain oxygen saturation >87% to allow pt to complete functional tasks.  -2 L O2 via [place] at all times titrate to maintain SPO2> 88% -Provide Inogen home O2 portable concentrator  Diabetes type 2 controlled with complication -[id number] hemoglobin A1c= 6.7 -Levemir 8 units  BID -NovoLog 0 - 9 units qac -NovoLog 0-5 units qhs -Tradjenta 5 mg daily  Essential hypertension -Blood pressure controlled -PCP to determine if/when to resume BP medication  Hyperlipidemia -Statin on hold -PCP to decide if/when to restart patient's statin therapy, after LFTs have fully normalized.  Elevated LFTs -Acute hepatitis panel negative -RUQ ultrasound nondiagnostic  Hypokalemia -Resolved  Goals of care -12/28 patient should be had a discharge on 12/29 Home O2 ordered, face-to-face complete    DVT prophylaxis: Lovenox Code Status: Full Family Communication: 12/28 spoke with Tilda Burrow (wife) explained plan of care answered all questions Disposition Plan:    Consultants:    Procedures/Significant Events:  12/26 US abdomen RUQ; normal   I have personally reviewed and interpreted all radiology studies and my  findings are as above.  VENTILATOR SETTINGS: HFNC 12/27 Flow; 6 L/min SPO2 96%   Cultures 12/12  POC SARS coronavirus positive 12/24 acute hepatitis panel negative    Antimicrobials: Anti-infectives (From admission, onward)   Start     Dose/Rate Stop   10/18/19 1800  remdesivir 100 mg in sodium chloride 0.9 % 100 mL IVPB     100 mg 200 mL/hr over 30 Minutes 10/19/19 1151   10/16/19 1000  remdesivir 100 mg in sodium chloride 0.9 % 100 mL IVPB  Status:  Discontinued     100 mg 200 mL/hr over 30 Minutes 10/15/19 1415   10/16/19 1000  remdesivir 100 mg in sodium chloride 0.9 % 100 mL IVPB  Status:  Discontinued     100 mg 200 mL/hr over 30 Minutes 10/16/19 0802   10/15/19 1415  remdesivir 200 mg in sodium chloride 0.9% 250 mL IVPB  Status:  Discontinued     200 mg 580 mL/hr over 30 Minutes 10/15/19 1415   10/14/19 1000  remdesivir 100 mg in sodium chloride 0.9 % 100 mL IVPB  Status:  Discontinued     100 mg 200 mL/hr over 30 Minutes 10/15/19 1921   10/13/19 1130  remdesivir 200 mg in sodium chloride 0.9% 250 mL IVPB     200 mg 580 mL/hr over 30 Minutes 10/13/19 1240       Devices    LINES / TUBES:      Continuous Infusions:   Physical Exam:  General: A/O x4, positive acute respiratory distress Eyes: negative scleral hemorrhage, negative anisocoria, negative icterus ENT: Negative Runny nose, negative gingival bleeding, Neck:  Negative scars, masses, torticollis, lymphadenopathy, JVD Lungs: Diffuse decreased breath sounds, without wheezes or crackles Cardiovascular: Regular rate and rhythm without murmur gallop or rub normal S1 and S2 Abdomen: negative abdominal pain, nondistended, positive soft, bowel sounds, no rebound, no ascites, no appreciable mass Extremities: No significant cyanosis, clubbing, or edema bilateral lower extremities Skin: Negative rashes, lesions, ulcers Psychiatric:  Negative depression, negative anxiety, negative fatigue, negative mania  Central nervous system:  Cranial nerves II through XII intact, tongue/uvula midline, all extremities  muscle strength 5/5, sensation intact throughout, negative dysarthria, negative expressive aphasia, negative receptive aphasia.  .     Data Reviewed: Care during the described time interval was provided by me .  I have reviewed this patient's available data, including medical history, events of note, physical examination, and all test results as part of my evaluation.   CBC: Recent Labs  Lab 10/16/19 0500 10/17/19 0220 10/18/19 0227 10/19/19 0037 10/20/19 0420  WBC 22.4* 22.4* 20.8* 22.5* 19.4*  NEUTROABS 20.6* 20.8* 19.5* 20.8* 18.2*  HGB 13.9 14.6 14.0 14.6 14.9  HCT 43.0 45.1 43.1 45.1 45.3  MCV 91.3 92.2 90.2 90.7 91.0  PLT 432* 426* 411* 411* [id number]   Basic Metabolic Panel: Recent Labs  Lab 10/16/19 0500 10/17/19 0220 10/18/19 0227 10/19/19 0037 10/20/19 0420  NA 138 139 136 136 136  K 4.7 4.9 4.9 4.7 4.8  CL 104 105 102 97* 103  CO2 23 24 23 25 26   GLUCOSE 166* 138* 139* 114* 127*  BUN 32* 34* 31* 29* 30*  CREATININE 0.71 0.66 0.72 0.65 0.61  CALCIUM 8.3* 8.3* 8.1* 7.7* 8.0*  MG 2.3 2.3 2.2 2.2 2.2  PHOS 4.3 4.3 4.1 3.7 4.1   GFR: Estimated Creatinine Clearance: 112.9 mL/min (by C-G formula based on SCr of 0.61 mg/dL). Liver Function Tests: Recent Labs  Lab 10/16/19 0500  10/17/19 0220 10/18/19 0227 10/19/19 0037 10/20/19 0420  AST 143* 67* 32 25 30  ALT 260* 247* 170* 124* 132*  ALKPHOS 76 74 78 71 72  BILITOT 0.7 1.0 0.5 1.0 1.4*  PROT 6.0* 6.2* 5.5* 5.7* 5.1*  ALBUMIN 2.7* 2.8* 2.5* 2.7* 2.6*   No results for input(s): LIPASE, AMYLASE in the last 168 hours. No results for input(s): AMMONIA in the last 168 hours. Coagulation Profile: No results for input(s): INR, PROTIME in the last 168 hours. Cardiac Enzymes: No results for input(s): CKTOTAL, CKMB, CKMBINDEX, TROPONINI in the last 168 hours. BNP (last 3 results) No results for input(s): PROBNP in the last 8760 hours. HbA1C: No results for input(s): HGBA1C in the last 72 hours. CBG: Recent  Labs  Lab 10/18/19 2153 10/19/19 0834 10/19/19 1035 10/19/19 2141 10/20/19 0749  GLUCAP 142* 93 152* 132* 103*   Lipid Profile: Recent Labs    10/18/19 0227  CHOL 170  HDL 20*  LDLCALC 128*  TRIG 110  CHOLHDL 8.5   Thyroid Function Tests: No results for input(s): TSH, T4TOTAL, FREET4, T3FREE, THYROIDAB in the last 72 hours. Anemia Panel: Recent Labs    10/19/19 0037 10/20/19 0420  FERRITIN 198 198   Urine analysis: No results found for: COLORURINE, APPEARANCEUR, LABSPEC, PHURINE, GLUCOSEU, HGBUR, BILIRUBINUR, KETONESUR, PROTEINUR, UROBILINOGEN, NITRITE, LEUKOCYTESUR Sepsis Labs: @LABRCNTIP (procalcitonin:4,lacticidven:4)  ) Recent Results (from the past 240 hour(s))  Blood Culture (routine x 2)     Status: None   Collection Time: 10/13/19  9:11 AM   Specimen: BLOOD RIGHT ARM  Result Value Ref Range Status   Specimen Description BLOOD RIGHT ARM DRAWN BY RN  Final   Special Requests   Final    BOTTLES DRAWN AEROBIC AND ANAEROBIC Blood Culture results may not be optimal due to an inadequate volume of blood received in culture bottles   Culture   Final    NO GROWTH 5 DAYS Performed at [facility], [address] [place], [place] [address]    Report Status 10/18/2019 FINAL  Final  Blood Culture (routine x 2)     Status: None   Collection Time: 10/13/19  9:16 AM   Specimen: BLOOD RIGHT HAND  Result Value Ref Range Status   Specimen Description BLOOD RIGHT HAND DRAWN BY RN  Final   Special Requests   Final    BOTTLES DRAWN AEROBIC AND ANAEROBIC Blood Culture results may not be optimal due to an inadequate volume of blood received in culture bottles   Culture   Final    NO GROWTH 5 DAYS Performed at [facility], [address], [place], [place] [address]    Report Status 10/18/2019 FINAL  Final         Radiology Studies: VAS [place] LOWER EXTREMITY VENOUS (DVT)  Result Date: 10/19/2019  Lower Venous Study Indications: Covid-19, D-Dimer elevated.  Comparison  Study: No prior study on file for comparison. Performing Technologist: Sharion Dove RVS  Examination Guidelines: A complete evaluation includes B-mode imaging, spectral Doppler, color Doppler, and power Doppler as needed of all accessible portions of each vessel. Bilateral testing is considered an integral part of a complete examination. Limited examinations for reoccurring indications may be performed as noted.  +---------+---------------+---------+-----------+----------+--------------+ RIGHT    CompressibilityPhasicitySpontaneityPropertiesThrombus Aging +---------+---------------+---------+-----------+----------+--------------+ CFV      Full           Yes      Yes                                 +---------+---------------+---------+-----------+----------+--------------+  SFJ      Full                                                        +---------+---------------+---------+-----------+----------+--------------+ FV Prox  Full                                                        +---------+---------------+---------+-----------+----------+--------------+ FV Mid   Full                                                        +---------+---------------+---------+-----------+----------+--------------+ FV DistalFull                                                        +---------+---------------+---------+-----------+----------+--------------+ PFV      Full                                                        +---------+---------------+---------+-----------+----------+--------------+ POP      Full           Yes      Yes                                 +---------+---------------+---------+-----------+----------+--------------+ PTV      Full                                                        +---------+---------------+---------+-----------+----------+--------------+ PERO     Full                                                         +---------+---------------+---------+-----------+----------+--------------+   +---------+---------------+---------+-----------+----------+--------------+ LEFT     CompressibilityPhasicitySpontaneityPropertiesThrombus Aging +---------+---------------+---------+-----------+----------+--------------+ CFV      Full           Yes      Yes                                 +---------+---------------+---------+-----------+----------+--------------+ SFJ      Full                                                        +---------+---------------+---------+-----------+----------+--------------+  FV Prox  Full                                                        +---------+---------------+---------+-----------+----------+--------------+ FV Mid   Full                                                        +---------+---------------+---------+-----------+----------+--------------+ FV DistalFull                                                        +---------+---------------+---------+-----------+----------+--------------+ PFV      Full                                                        +---------+---------------+---------+-----------+----------+--------------+ POP      Full           Yes      Yes                                 +---------+---------------+---------+-----------+----------+--------------+ PTV      Full                                                        +---------+---------------+---------+-----------+----------+--------------+ PERO     Full                                                        +---------+---------------+---------+-----------+----------+--------------+     Summary: Right: There is no evidence of deep vein thrombosis in the lower extremity. Left: There is no evidence of deep vein thrombosis in the lower extremity.  *See table(s) above for measurements and observations. Electronically signed by Monica Martinez MD on  10/19/2019 at 1:42:40 PM.    Final         Scheduled Meds: . enoxaparin (LOVENOX) injection  40 mg Subcutaneous BID  . insulin aspart  0-5 Units Subcutaneous QHS  . insulin aspart  0-9 Units Subcutaneous TID WC  . insulin detemir  0.075 Units/kg Subcutaneous BID  . linagliptin  5 mg Oral Daily  . methylPREDNISolone (SOLU-MEDROL) injection  50 mg Intravenous Q12H  . pantoprazole  40 mg Oral Daily   Continuous Infusions:   LOS: 7 days   The patient is critically ill with multiple organ systems failure and requires high complexity decision making for assessment and support, frequent evaluation and titration of therapies, application of advanced monitoring technologies and extensive interpretation of multiple databases.  Critical Care Time devoted to patient care services described in this note  Time spent: 40 minutes     Arianna Delsanto, Geraldo Docker, MD [facility] Pager [phone]  If 7PM-7AM, please contact night-coverage [email or web address] Password [email or web address] 10/20/2019, 10:07 AM

## 2019-10-20 NOTE — Plan of Care (Signed)
Remains AOx4, 3.5L HFNC, denied all needs, adequate urine output, completed Remdisivir, safety precautions maintained continue poc, Patient progressing as scheduled Problem: Education: Goal: Knowledge of risk factors and measures for prevention of condition will improve Outcome: Progressing   Problem: Respiratory: Goal: Will maintain a patent airway Outcome: Progressing Goal: Complications related to the disease process, condition or treatment will be avoided or minimized Outcome: Progressing   Problem: Education: Goal: Knowledge of General Education information will improve Description: Including pain rating scale, medication(s)/side effects and non-pharmacologic comfort measures Outcome: Progressing   Problem: Health Behavior/Discharge Planning: Goal: Ability to manage health-related needs will improve Outcome: Progressing   Problem: Clinical Measurements: Goal: Ability to maintain clinical measurements within normal limits will improve Outcome: Progressing Goal: Will remain free from infection Outcome: Progressing Goal: Diagnostic test results will improve Outcome: Progressing Goal: Respiratory complications will improve Outcome: Progressing Goal: Cardiovascular complication will be avoided Outcome: Progressing   Problem: Activity: Goal: Risk for activity intolerance will decrease Outcome: Progressing   Problem: Nutrition: Goal: Adequate nutrition will be maintained Outcome: Progressing   Problem: Pain Managment: Goal: General experience of comfort will improve Outcome: Progressing   Problem: Safety: Goal: Ability to remain free from injury will improve Outcome: Progressing   Problem: Skin Integrity: Goal: Risk for impaired skin integrity will decrease Outcome: Progressing

## 2019-10-20 NOTE — Progress Notes (Signed)
SATURATION QUALIFICATIONS: (This note is used to comply with regulatory documentation for home oxygen)  Patient Saturations on Room Air at Rest = 85%  Patient Saturations on Room Air while Ambulating = NA (drops at rest)  Patient Saturations on 4 Liters of oxygen while Ambulating >=90%  Please briefly explain why patient needs home oxygen:  To maintain oxygen saturation >87% to allow pt to complete functional tasks.    Arby Barrette, PT Pager [phone]

## 2019-10-21 DIAGNOSIS — I10 Essential (primary) hypertension: Secondary | ICD-10-CM | POA: Diagnosis present

## 2019-10-21 DIAGNOSIS — E118 Type 2 diabetes mellitus with unspecified complications: Secondary | ICD-10-CM | POA: Diagnosis present

## 2019-10-21 DIAGNOSIS — E785 Hyperlipidemia, unspecified: Secondary | ICD-10-CM | POA: Diagnosis present

## 2019-10-21 DIAGNOSIS — E7800 Pure hypercholesterolemia, unspecified: Secondary | ICD-10-CM

## 2019-10-21 DIAGNOSIS — E1169 Type 2 diabetes mellitus with other specified complication: Secondary | ICD-10-CM | POA: Diagnosis present

## 2019-10-21 DIAGNOSIS — R748 Abnormal levels of other serum enzymes: Secondary | ICD-10-CM | POA: Diagnosis present

## 2019-10-21 LAB — CBC WITH DIFFERENTIAL/PLATELET
Abs Immature Granulocytes: 0.12 10*3/uL — ABNORMAL HIGH (ref 0.00–0.07)
Basophils Absolute: 0 10*3/uL (ref 0.0–0.1)
Basophils Relative: 0 %
Eosinophils Absolute: 0.1 10*3/uL (ref 0.0–0.5)
Eosinophils Relative: 0 %
HCT: 45.5 % (ref 39.0–52.0)
Hemoglobin: 14.9 g/dL (ref 13.0–17.0)
Immature Granulocytes: 1 %
Lymphocytes Relative: 11 %
Lymphs Abs: 2 10*3/uL (ref 0.7–4.0)
MCH: 30.1 pg (ref 26.0–34.0)
MCHC: 32.7 g/dL (ref 30.0–36.0)
MCV: 91.9 fL (ref 80.0–100.0)
Monocytes Absolute: 1 10*3/uL (ref 0.1–1.0)
Monocytes Relative: 6 %
Neutro Abs: 14.7 10*3/uL — ABNORMAL HIGH (ref 1.7–7.7)
Neutrophils Relative %: 82 %
Platelets: 371 10*3/uL (ref 150–400)
RBC: 4.95 MIL/uL (ref 4.22–5.81)
RDW: 13.5 % (ref 11.5–15.5)
WBC: 17.9 10*3/uL — ABNORMAL HIGH (ref 4.0–10.5)
nRBC: 0 % (ref 0.0–0.2)

## 2019-10-21 LAB — COMPREHENSIVE METABOLIC PANEL
ALT: 103 U/L — ABNORMAL HIGH (ref 0–44)
AST: 25 U/L (ref 15–41)
Albumin: 2.5 g/dL — ABNORMAL LOW (ref 3.5–5.0)
Alkaline Phosphatase: 71 U/L (ref 38–126)
Anion gap: 10 (ref 5–15)
BUN: 28 mg/dL — ABNORMAL HIGH (ref 8–23)
CO2: 27 mmol/L (ref 22–32)
Calcium: 8.1 mg/dL — ABNORMAL LOW (ref 8.9–10.3)
Chloride: 101 mmol/L (ref 98–111)
Creatinine, Ser: 0.75 mg/dL (ref 0.61–1.24)
GFR calc Af Amer: >60 mL/min (ref >60)
GFR calc non Af Amer: >60 mL/min (ref >60)
Glucose, Bld: 60 mg/dL — ABNORMAL LOW (ref 70–99)
Potassium: 4.4 mmol/L (ref 3.5–5.1)
Sodium: 138 mmol/L (ref 135–145)
Total Bilirubin: 1 mg/dL (ref 0.3–1.2)
Total Protein: 5 g/dL — ABNORMAL LOW (ref 6.5–8.1)

## 2019-10-21 LAB — D-DIMER, QUANTITATIVE: D-Dimer, Quant: 1.77 ug/mL-FEU — ABNORMAL HIGH (ref 0.00–0.50)

## 2019-10-21 LAB — GLUCOSE, CAPILLARY
Glucose-Capillary: 139 mg/dL — ABNORMAL HIGH (ref 70–99)
Glucose-Capillary: 71 mg/dL (ref 70–99)
Glucose-Capillary: 136 mg/dL — ABNORMAL HIGH (ref 70–99)

## 2019-10-21 LAB — C-REACTIVE PROTEIN: CRP: <0.5 mg/dL (ref <1.0)

## 2019-10-21 LAB — MAGNESIUM: Magnesium: 2.3 mg/dL (ref 1.7–2.4)

## 2019-10-21 LAB — PHOSPHORUS: Phosphorus: 3.6 mg/dL (ref 2.5–4.6)

## 2019-10-21 LAB — FERRITIN: Ferritin: 188 ng/mL (ref 24–336)

## 2019-10-21 MED ORDER — "PEN NEEDLES 1/2"" 29G X 12MM MISC": 0 refills | Status: DC

## 2019-10-21 MED ORDER — PANTOPRAZOLE SODIUM 40 MG PO TBEC: 40.0000 mg | DELAYED_RELEASE_TABLET | Freq: Every day | ORAL | 0 refills | Status: DC

## 2019-10-21 MED ORDER — ONDANSETRON HCL 4 MG PO TABS: 4.0000 mg | ORAL_TABLET | Freq: Four times a day (QID) | ORAL | 0 refills | Status: AC | PRN

## 2019-10-21 MED ORDER — ASPIRIN EC 81 MG PO TBEC: 81.0000 mg | DELAYED_RELEASE_TABLET | Freq: Every day | ORAL | 0 refills | Status: AC | PRN

## 2019-10-21 MED ORDER — POLYETHYLENE GLYCOL 3350 17 G PO PACK: 17.0000 g | PACK | Freq: Every day | ORAL | 0 refills | Status: AC | PRN

## 2019-10-21 MED ORDER — TRAMADOL HCL 50 MG PO TABS: 50.0000 mg | ORAL_TABLET | Freq: Four times a day (QID) | ORAL | 0 refills | Status: AC | PRN

## 2019-10-21 MED ORDER — LINAGLIPTIN 5 MG PO TABS: 5.0000 mg | ORAL_TABLET | Freq: Every day | ORAL | 0 refills | Status: DC

## 2019-10-21 MED ORDER — INSULIN DETEMIR 100 UNIT/ML FLEXPEN: 8.0000 [IU] | PEN_INJECTOR | Freq: Two times a day (BID) | SUBCUTANEOUS | 11 refills | Status: DC

## 2019-10-21 MED ORDER — ONDANSETRON HCL 4 MG PO TABS: 4.0000 mg | ORAL_TABLET | Freq: Four times a day (QID) | ORAL | 0 refills | Status: DC | PRN

## 2019-10-21 MED ORDER — PREDNISONE 20 MG PO TABS: 60.0000 mg | ORAL_TABLET | Freq: Every day | ORAL | 0 refills | Status: DC

## 2019-10-21 MED ORDER — INSULIN DETEMIR 100 UNIT/ML FLEXPEN: 8.0000 [IU] | PEN_INJECTOR | Freq: Two times a day (BID) | SUBCUTANEOUS | 0 refills | Status: DC

## 2019-10-21 MED ORDER — PANTOPRAZOLE SODIUM 40 MG PO TBEC: 40.0000 mg | DELAYED_RELEASE_TABLET | Freq: Every day | ORAL | 0 refills | Status: AC

## 2019-10-21 MED ORDER — POLYETHYLENE GLYCOL 3350 17 G PO PACK: 17.0000 g | PACK | Freq: Every day | ORAL | 0 refills | Status: DC | PRN

## 2019-10-21 MED ORDER — PREDNISONE 20 MG PO TABS: 60.0000 mg | ORAL_TABLET | Freq: Every day | ORAL | 0 refills | Status: AC

## 2019-10-21 MED ORDER — METFORMIN HCL 500 MG PO TABS: 500.0000 mg | ORAL_TABLET | Freq: Two times a day (BID) | ORAL | 0 refills | Status: AC

## 2019-10-21 MED ORDER — METFORMIN HCL 500 MG PO TABS: 500.0000 mg | ORAL_TABLET | Freq: Two times a day (BID) | ORAL | 0 refills | Status: DC

## 2019-10-21 MED ORDER — ASPIRIN EC 81 MG PO TBEC: 81.0000 mg | DELAYED_RELEASE_TABLET | Freq: Every day | ORAL | 0 refills | Status: DC | PRN

## 2019-10-21 MED ORDER — "PEN NEEDLES 1/2"" 29G X 12MM MISC": 0 refills | Status: AC

## 2019-10-21 MED ORDER — INSULIN DETEMIR 100 UNIT/ML FLEXPEN: 8.0000 [IU] | PEN_INJECTOR | Freq: Two times a day (BID) | SUBCUTANEOUS | 0 refills | Status: AC

## 2019-10-21 MED ORDER — SIMVASTATIN 40 MG PO TABS: 40.0000 mg | ORAL_TABLET | Freq: Every evening | ORAL | 0 refills | Status: DC

## 2019-10-21 MED ORDER — METFORMIN HCL 500 MG PO TABS: 500.0000 mg | ORAL_TABLET | Freq: Two times a day (BID) | ORAL | Status: DC

## 2019-10-21 MED ORDER — LINAGLIPTIN 5 MG PO TABS: 5.0000 mg | ORAL_TABLET | Freq: Every day | ORAL | 0 refills | Status: AC

## 2019-10-21 NOTE — Progress Notes (Signed)
Occupational Therapy Treatment Patient Details Name: Eduardo Dunn MRN: [id number] DOB: 10/04/1956 Today's Date: 10/21/2019    History of present illness 63 y.o. male with medical history of hypertension and hyperlipidemia presenting with worsening fever and shortness of breath for approximately 3 days.  The patient actually had been having fevers for approximately 10 days.  He was initially tested positive for COVID-19 on 10/04/2019.  He was having fevers up to 102.5 F at that time with a nonproductive cough.  He placed himself in self quarantine after the positive Covid test.   OT comments  Pt progressing towards established OT goals and continues to present with decreased activity tolerance with fatigue and decreased SpO2. Pt performing dressing and LB bathing with Supervision-Min Guard A; SpO2 dropping to low 80s on RA. Pt requiring seated rest breaks to elevate SpO2 to >87% on RA. During mobility in hallway, pt requiring Min Guard A as well as 2L O2 and standing rest breaks with purse lip breathing to maintain SpO2 >85%. Continue to recommend dc to home with HHOT and will continue to follow acutely as admitted.   Follow Up Recommendations  Home health OT;Supervision/Assistance - 24 hour    Equipment Recommendations  Tub/shower seat    Recommendations for Other Services      Precautions / Restrictions Precautions Precautions: Fall Precaution Comments: desat with mobility Restrictions Weight Bearing Restrictions: No       Mobility Bed Mobility Overal bed mobility: Needs Assistance Bed Mobility: Supine to Sit     Supine to sit: Supervision     General bed mobility comments: Supervision for safety  Transfers Overall transfer level: Needs assistance Equipment used: None Transfers: Sit to/from Stand Sit to Stand: Supervision         General transfer comment: Supervision for safety    Balance Overall balance assessment: Needs assistance Sitting-balance support:  Feet supported Sitting balance-Leahy Scale: Good     Standing balance support: No upper extremity supported;Bilateral upper extremity supported Standing balance-Leahy Scale: Fair                             ADL either performed or assessed with clinical judgement   ADL Overall ADL's : Needs assistance/impaired             Lower Body Bathing: Supervison/ safety;Sit to/from stand Lower Body Bathing Details (indicate cue type and reason): Performing peri care at sink with supervision for safety Upper Body Dressing : Set up;Sitting Upper Body Dressing Details (indicate cue type and reason): Donning shirt Lower Body Dressing: Min guard;Sit to/from stand Lower Body Dressing Details (indicate cue type and reason): Min Guard A for safety. Donning pants and shoes Toilet Transfer: Supervision/safety;Ambulation(simulated to recliner) Armed forces technical officer Details (indicate cue type and reason): Supervision for safety         Functional mobility during ADLs: Min guard General ADL Comments: Continues to present with decreased strength and activity tolerance. Requiring several seated and standing rest breaks during ADLs and mobility. SpO2 dropping to 80% on RA with activity; requiring breaks and purse lip breathing to elevate back to >87%     Vision       Perception     Praxis      Cognition Arousal/Alertness: Awake/alert Behavior During Therapy: WFL for tasks assessed/performed;Flat affect Overall Cognitive Status: Within Functional Limits for tasks assessed  Exercises     Shoulder Instructions       General Comments SpO2 dropping to low 80s on RA during activity. Pt requiring seated rest breaks to return to >87% on RA. During mobility, pt requiring 2L O2 to maintain SpO2 >85% and continued to require standing rest breaks with purse lip breathing    Pertinent Vitals/ Pain       Pain Assessment: No/denies  pain  Home Living                                          Prior Functioning/Environment              Frequency  Min 2X/week        Progress Toward Goals  OT Goals(current goals can now be found in the care plan section)  Progress towards OT goals: Progressing toward goals  Acute Rehab OT Goals Patient Stated Goal: get better and go home OT Goal Formulation: With patient Time For Goal Achievement: 11/02/19 Potential to Achieve Goals: Good ADL Goals Pt Will Perform Grooming: with modified independence;standing Pt Will Perform Lower Body Bathing: with modified independence Pt Will Perform Lower Body Dressing: with modified independence;sit to/from stand Pt Will Transfer to Toilet: ambulating;with modified independence Pt Will Perform Toileting - Clothing Manipulation and hygiene: with modified independence;sit to/from stand Pt/caregiver will Perform Home Exercise Program: Increased strength;Both right and left upper extremity;With written HEP provided Additional ADL Goal #1: Pt will perform standing ADL/mobility task at mod independent level with VSS.  Plan Discharge plan remains appropriate    Co-evaluation                 AM-PAC OT "6 Clicks" Daily Activity     Outcome Measure   Help from another person eating meals?: None Help from another person taking care of personal grooming?: A Little Help from another person toileting, which includes using toliet, bedpan, or urinal?: A Little Help from another person bathing (including washing, rinsing, drying)?: A Little Help from another person to put on and taking off regular upper body clothing?: None Help from another person to put on and taking off regular lower body clothing?: A Lot 6 Click Score: 19    End of Session Equipment Utilized During Treatment: Oxygen  OT Visit Diagnosis: Muscle weakness (generalized) (M62.81);Unsteadiness on feet (R26.81)   Activity Tolerance Patient tolerated  treatment well   Patient Left in chair;with call bell/phone within reach   Nurse Communication Mobility status        Time: [id number] OT Time Calculation (min): 34 min  Charges: OT General Charges $OT Visit: 1 Visit OT Treatments $Self Care/Home Management : 23-37 mins  [place], OTR/L Acute Rehab Pager: [phone] Office: [place] 10/21/2019, 11:39 AM

## 2019-10-21 NOTE — Progress Notes (Signed)
Discharge teaching complete, pt verbalized understanding. [employer] delivered O@ tank and sat probe to pt door. Waiting on [employer] to deliver O2 tank to pt home. Wife will be then be picking pt up in private vehicle

## 2019-10-21 NOTE — Plan of Care (Signed)
Uneventful shift for patient, remains Aox4, afebrile, sats >95% on 2L [place], on track for discharge today per provider notes, safety precautions maintained, continue plan of care. Problem: Education: Goal: Knowledge of risk factors and measures for prevention of condition will improve Outcome: Progressing   Problem: Respiratory: Goal: Will maintain a patent airway Outcome: Progressing Goal: Complications related to the disease process, condition or treatment will be avoided or minimized Outcome: Progressing   Problem: Education: Goal: Knowledge of General Education information will improve Description: Including pain rating scale, medication(s)/side effects and non-pharmacologic comfort measures Outcome: Progressing   Problem: Health Behavior/Discharge Planning: Goal: Ability to manage health-related needs will improve Outcome: Progressing   Problem: Clinical Measurements: Goal: Ability to maintain clinical measurements within normal limits will improve Outcome: Progressing Goal: Will remain free from infection Outcome: Progressing Goal: Diagnostic test results will improve Outcome: Progressing Goal: Respiratory complications will improve Outcome: Progressing Goal: Cardiovascular complication will be avoided Outcome: Progressing   Problem: Activity: Goal: Risk for activity intolerance will decrease Outcome: Progressing   Problem: Nutrition: Goal: Adequate nutrition will be maintained Outcome: Progressing   Problem: Pain Managment: Goal: General experience of comfort will improve Outcome: Progressing   Problem: Safety: Goal: Ability to remain free from injury will improve Outcome: Progressing   Problem: Skin Integrity: Goal: Risk for impaired skin integrity will decrease Outcome: Progressing

## 2019-10-21 NOTE — TOC Transition Note (Signed)
Transition of Care [facility] - CM/SW Discharge Note   Patient Details  Name: LITTLETON IBARA MRN: [id number] Date of Birth: 1956-10-06  [facility]) CM/SW Contact:  Amador Cunas, [place] Phone Number: 10/21/2019, 12:50 PM   Clinical Narrative:   Pt for dc home with wife today. [employer] delivered oxygen to home and provided pt with portable tank for transportation. Confirmed with Magda Paganini with [employer] 3:1 to be delivered to home. SW signing off at dc.   Wandra Feinstein, MSW, LCSW [phone] (GV coverage)       Final next level of care: Home/Self Care Barriers to Discharge: No Barriers Identified   Patient Goals and CMS Choice        Discharge Placement                    Patient and family notified of of transfer: 10/21/19  Discharge Plan and Services   Discharge Planning Services: CM Consult            DME Arranged: Oxygen, 3-N-1 DME Agency: [place], [employer](3:1 with [employer]; Oxygen with [employer]) Date DME Agency Contacted: 10/21/19   Representative spoke with at DME Agency: Magda Paganini [employer]) Caryl Pina [employer])            Social Determinants of Health (SDOH) Interventions     Readmission Risk Interventions No flowsheet data found.

## 2019-10-21 NOTE — Discharge Summary (Addendum)
Physician Discharge Summary  Eduardo Dunn [id number] DOB: [birth year] DOA: 10/13/2019  PCP: Lucia Gaskins, MD  Admit date: 10/13/2019 Discharge date: 10/21/2019  Time spent: 30 minutes  Recommendations for Outpatient Follow-up:   Acute respiratory failure with hypoxia secondary to COVID-19 pneumonia COVID-19 Labs  Recent Labs    10/19/19 0037 10/20/19 0420 10/21/19 0405  DDIMER 1.74* 1.86* 1.77*  FERRITIN 198 198 188  CRP 0.6 0.6 <0.5    12/12 POC SARS coronavirus positive  -12/21 Actemra x1 dose -Prednisone 60 mg daily -Completed course of Remdesivir SATURATION QUALIFICATIONS: (Thisnote is usedto comply with regulatory documentation for home oxygen) Patient Saturations on Room Air at Rest =85% Patient Saturations on [employer] while Ambulating =NA (drops at rest) Patient Saturations on4Liters of oxygen while Ambulating >=90% Please briefly explain why patient needs home oxygen: To maintain oxygen saturation >87% to allow pt to complete functional tasks. -2 L O2 via [place] at all times titrate to maintain SPO2> 88% -Provide [employer] home O2 portable concentrator  Diabetes type 2 controlled with complication -[id number] hemoglobin A1c= 6.7 -Levemir 8 units  BID -Metformin 500 mg BID -Tradjenta 5 mg daily -Follow-up with PCP for adjustments of medication  Essential hypertension -Blood pressure controlled -PCP to determine if/when to resume BP medication  Hyperlipidemia -Statin on hold -PCP to decide if/when to restart patient's statin therapy, after LFTs have fully normalized.  Elevated LFTs -Acute hepatitis panel negative -RUQ ultrasound nondiagnostic  Hypokalemia -Resolved  Discharge Diagnoses:  Active Problems:   Pneumonia due to COVID-19 virus   Acute respiratory failure with hypoxia (HCC)   Diabetes mellitus type 2, controlled, with complications ([place])   Essential hypertension   HLD (hyperlipidemia)   Elevated liver  enzymes    Discharge Condition: Stable  Diet recommendation: Carb modified  Filed Weights   10/13/19 0853  Weight: 106.6 kg    History of present illness:  Eduardo Dunn a 63 y.o. WM PMHx HTN, HLD, diabetes type 2 controlled with complication, HTN  Presenting with worsening fever and shortness of breath for approximately 3 days. The patient actually had been having fevers for approximately 10 days. He was initially tested positive for COVID-19 on 10/04/2019. He was having fevers up to 102.5 F at that time with a nonproductive cough. He placed himself in self quarantine after the positive Covid test. He was not particularly short of breath when he was initially tested. However over the past 3 days, he has noted increasing shortness of breath, but denies any chest pain, headache, hemoptysis, nausea, vomiting, diarrhea, abdominal pain, dysuria, hematuria.He has had a nonproductive cough occasionally bringing up some yellow sputum. He denies any rashes or joint synovitis. 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 Because of worsening shortness of breath, he came to emergency department for further evaluation. EMS initially noted the patient to be hypoxemic with oxygen saturation 73% on room air. He was initially placed on 4 L with oxygen saturations in the mid 80s. In the emergency department, the patient remained hypoxemic and was placed on nonrebreather. He was afebrile hemodynamically stable. Chest x-ray shows bilateral patchy infiltrates.oxygen saturation is 92-94% on nonrebreather. He has not had any increased work of breathing. BMP was unremarkable except for potassium 3.2. LFTs were unremarkable. WBC was 17.2 with platelets 425,000. CRP was 8.1. Procalcitonin <0.10.The patient was started on dexamethasone and remdesivir.  Hospital Course:  During his hospitalization patient was treated for Covid pneumonia with acute respiratory failure with hypoxia, with  standard Covid protocol  has responded well.  Patient will be discharged with sequela of requiring O2 support at home.  Patient's other current chronic health problems were also addressed.  Stable for discharge.    Procedures: 12/26 US abdomen RUQ; normal   Ventilator settings; HFNC 12/27 Flow; 6 L/min SPO2 96%  Cultures  12/12 POC SARS coronavirus positive 12/24 acute hepatitis panel negative    Antibiotics Anti-infectives (From admission, onward)   Start     Dose/Rate Route Frequency Ordered Stop   10/18/19 1800  remdesivir 100 mg in sodium chloride 0.9 % 100 mL IVPB     100 mg 200 mL/hr over 30 Minutes Intravenous Every 24 hours 10/18/19 1523 10/19/19 1151   10/16/19 1000  remdesivir 100 mg in sodium chloride 0.9 % 100 mL IVPB  Status:  Discontinued     100 mg 200 mL/hr over 30 Minutes Intravenous Daily 10/15/19 1405 10/15/19 1415   10/16/19 1000  remdesivir 100 mg in sodium chloride 0.9 % 100 mL IVPB  Status:  Discontinued     100 mg 200 mL/hr over 30 Minutes Intravenous Daily 10/15/19 1417 10/16/19 0802   10/15/19 1415  remdesivir 200 mg in sodium chloride 0.9% 250 mL IVPB  Status:  Discontinued     200 mg 580 mL/hr over 30 Minutes Intravenous Once 10/15/19 1405 10/15/19 1415   10/14/19 1000  remdesivir 100 mg in sodium chloride 0.9 % 100 mL IVPB  Status:  Discontinued     100 mg 200 mL/hr over 30 Minutes Intravenous Daily 10/13/19 1028 10/15/19 1921   10/13/19 1130  remdesivir 200 mg in sodium chloride 0.9% 250 mL IVPB     200 mg 580 mL/hr over 30 Minutes Intravenous Once 10/13/19 1028 10/13/19 1240       Discharge Exam: Vitals:   10/21/19 0050 10/21/19 0500 10/21/19 0723 10/21/19 1056  BP: 116/67 120/75 117/82 122/72  Pulse: 72 67 73 89  Resp: (!) 24 18 20 18   Temp: 98.5 F (36.9 C) 98.6 F (37 C) 98 F (36.7 C) 98.1 F (36.7 C)  TempSrc:   Oral Oral  SpO2: 98% 99% 95% 93%  Weight:      Height:        General: A/O x4, positive acute respiratory  distress Eyes: negative scleral hemorrhage, negative anisocoria, negative icterus ENT: Negative Runny nose, negative gingival bleeding, Neck:  Negative scars, masses, torticollis, lymphadenopathy, JVD Lungs: Diffuse decreased breath sounds, without wheezes or crackles Cardiovascular: Regular rate and rhythm without murmur gallop or rub normal S1 and S2    Discharge Instructions   Allergies as of 10/21/2019   No Known Allergies     Medication List    STOP taking these medications   amLODipine 5 MG tablet Commonly known as: NORVASC   lisinopril 20 MG tablet Commonly known as: ZESTRIL     TAKE these medications   aspirin EC 81 MG tablet Take 1 tablet (81 mg total) by mouth daily as needed.   Insulin Detemir 100 UNIT/ML Pen Commonly known as: LEVEMIR Inject 8 Units into the skin 2 (two) times daily.   linagliptin 5 MG Tabs tablet Commonly known as: TRADJENTA Take 1 tablet (5 mg total) by mouth daily. Start taking on: October 22, 2019   metFORMIN 500 MG tablet Commonly known as: GLUCOPHAGE Take 1 tablet (500 mg total) by mouth 2 (two) times daily with a meal.   ondansetron 4 MG tablet Commonly known as: ZOFRAN Take 1 tablet (4 mg total) by mouth every 6 (  six) hours as needed for nausea.   pantoprazole 40 MG tablet Commonly known as: PROTONIX Take 1 tablet (40 mg total) by mouth daily. Start taking on: October 22, 2019   PEN NEEDLES 29GX1/2" 29G X 12MM Misc Use as directed   polyethylene glycol 17 g packet Commonly known as: MIRALAX / GLYCOLAX Take 17 g by mouth daily as needed for moderate constipation.   predniSONE 20 MG tablet Commonly known as: DELTASONE Take 3 tablets (60 mg total) by mouth daily with breakfast. Start taking on: October 22, 2019   simvastatin 40 MG tablet Commonly known as: ZOCOR Take 1 tablet (40 mg total) by mouth every evening.   traMADol 50 MG tablet Commonly known as: ULTRAM Take 1 tablet (50 mg total) by mouth every 6  (six) hours as needed for moderate pain.            Durable Medical Equipment  (From admission, onward)         Start     Ordered   10/21/19 1326  For home use only DME Shower stool  Once     10/21/19 1325   10/21/19 1257  For home use only DME 3 n 1  Once     10/21/19 1256   10/20/19 1611  For home use only DME oxygen  Once    Comments: SATURATION QUALIFICATIONS: (This note is used to comply with regulatory documentation for home oxygen) Patient Saturations on Room Air at Rest = 85% Patient Saturations on Room Air while Ambulating = NA (drops at rest) Patient Saturations on 4 Liters of oxygen while Ambulating >=90% Please briefly explain why patient needs home oxygen: To maintain oxygen saturation >87% to allow pt to complete functional tasks -2 L O2 via [place] at all times titrate to maintain SPO2> 88% -Provide [employer] home O2 portable concentrator  Question Answer Comment  Length of Need 12 Months   Mode or (Route) Nasal cannula   Liters per Minute 2   Frequency Continuous (stationary and portable oxygen unit needed)   Oxygen conserving device Yes   Oxygen delivery system Gas      10/20/19 1612         No Known Allergies    The results of significant diagnostics from this hospitalization (including imaging, microbiology, ancillary and laboratory) are listed below for reference.    Significant Diagnostic Studies: DG Chest Portable 1 View  Result Date: 10/13/2019 CLINICAL DATA:  Shortness of breath for 3 days. The patient was diagnosed with COVID-19 11 days ago. EXAM: PORTABLE CHEST 1 VIEW COMPARISON:  None. FINDINGS: Patchy bilateral airspace disease is consistent with pneumonia. Heart size is normal. Aortic atherosclerosis noted. No pneumothorax or pleural effusion. No acute or focal bony abnormality. IMPRESSION: Patchy bilateral airspace disease consistent with pneumonia. Electronically Signed   By: Inge Rise M.D.   On: 10/13/2019 09:37   VAS [place] LOWER  EXTREMITY VENOUS (DVT)  Result Date: 10/19/2019  Lower Venous Study Indications: Covid-19, D-Dimer elevated.  Comparison Study: No prior study on file for comparison. Performing Technologist: Sharion Dove RVS  Examination Guidelines: A complete evaluation includes B-mode imaging, spectral Doppler, color Doppler, and power Doppler as needed of all accessible portions of each vessel. Bilateral testing is considered an integral part of a complete examination. Limited examinations for reoccurring indications may be performed as noted.  +---------+---------------+---------+-----------+----------+--------------+ RIGHT    CompressibilityPhasicitySpontaneityPropertiesThrombus Aging +---------+---------------+---------+-----------+----------+--------------+ CFV      Full           Yes  Yes                                 +---------+---------------+---------+-----------+----------+--------------+ SFJ      Full                                                        +---------+---------------+---------+-----------+----------+--------------+ FV Prox  Full                                                        +---------+---------------+---------+-----------+----------+--------------+ FV Mid   Full                                                        +---------+---------------+---------+-----------+----------+--------------+ FV DistalFull                                                        +---------+---------------+---------+-----------+----------+--------------+ PFV      Full                                                        +---------+---------------+---------+-----------+----------+--------------+ POP      Full           Yes      Yes                                 +---------+---------------+---------+-----------+----------+--------------+ PTV      Full                                                         +---------+---------------+---------+-----------+----------+--------------+ PERO     Full                                                        +---------+---------------+---------+-----------+----------+--------------+   +---------+---------------+---------+-----------+----------+--------------+ LEFT     CompressibilityPhasicitySpontaneityPropertiesThrombus Aging +---------+---------------+---------+-----------+----------+--------------+ CFV      Full           Yes      Yes                                 +---------+---------------+---------+-----------+----------+--------------+ SFJ      Full                                                        +---------+---------------+---------+-----------+----------+--------------+  FV Prox  Full                                                        +---------+---------------+---------+-----------+----------+--------------+ FV Mid   Full                                                        +---------+---------------+---------+-----------+----------+--------------+ FV DistalFull                                                        +---------+---------------+---------+-----------+----------+--------------+ PFV      Full                                                        +---------+---------------+---------+-----------+----------+--------------+ POP      Full           Yes      Yes                                 +---------+---------------+---------+-----------+----------+--------------+ PTV      Full                                                        +---------+---------------+---------+-----------+----------+--------------+ PERO     Full                                                        +---------+---------------+---------+-----------+----------+--------------+     Summary: Right: There is no evidence of deep vein thrombosis in the lower extremity. Left: There is no evidence of deep  vein thrombosis in the lower extremity.  *See table(s) above for measurements and observations. Electronically signed by Monica Martinez MD on 10/19/2019 at 1:42:40 PM.    Final    US Abdomen Limited RUQ  Result Date: 10/18/2019 CLINICAL DATA:  COVID-19 infection and elevated liver enzymes. EXAM: ULTRASOUND ABDOMEN LIMITED RIGHT UPPER QUADRANT COMPARISON:  CT of the abdomen on 05/11/2008 FINDINGS: Gallbladder: No gallstones or wall thickening visualized. No sonographic Murphy sign noted by sonographer. Common bile duct: Diameter: 3 mm. Liver: No focal lesion identified. Within normal limits in parenchymal echogenicity. Portal vein is patent on color Doppler imaging with normal direction of blood flow towards the liver. Other: No visualized ascites. IMPRESSION: Normal right upper quadrant abdominal ultrasound. Electronically Signed   By: Aletta Edouard M.D.   On: 10/18/2019 08:52    Microbiology: Recent Results (from the past 240 hour(s))  Blood Culture (routine x 2)     Status: None   Collection Time: 10/13/19  9:11 AM   Specimen: BLOOD RIGHT ARM  Result Value Ref Range Status   Specimen Description BLOOD RIGHT ARM DRAWN BY RN  Final   Special Requests   Final    BOTTLES DRAWN AEROBIC AND ANAEROBIC Blood Culture results may not be optimal due to an inadequate volume of blood received in culture bottles   Culture   Final    NO GROWTH 5 DAYS Performed at [facility], [address], [place], [place] [address]    Report Status 10/18/2019 FINAL  Final  Blood Culture (routine x 2)     Status: None   Collection Time: 10/13/19  9:16 AM   Specimen: BLOOD RIGHT HAND  Result Value Ref Range Status   Specimen Description BLOOD RIGHT HAND DRAWN BY RN  Final   Special Requests   Final    BOTTLES DRAWN AEROBIC AND ANAEROBIC Blood Culture results may not be optimal due to an inadequate volume of blood received in culture bottles   Culture   Final    NO GROWTH 5 DAYS Performed at [facility], [address], [place], [place] [address]    Report Status 10/18/2019 FINAL  Final     Labs: Basic Metabolic Panel: Recent Labs  Lab 10/17/19 0220 10/18/19 0227 10/19/19 0037 10/20/19 0420 10/21/19 0405  NA 139 136 136 136 138  K 4.9 4.9 4.7 4.8 4.4  CL 105 102 97* 103 101  CO2 24 23 25 26 27   GLUCOSE 138* 139* 114* 127* 60*  BUN 34* 31* 29* 30* 28*  CREATININE 0.66 0.72 0.65 0.61 0.75  CALCIUM 8.3* 8.1* 7.7* 8.0* 8.1*  MG 2.3 2.2 2.2 2.2 2.3  PHOS 4.3 4.1 3.7 4.1 3.6   Liver Function Tests: Recent Labs  Lab 10/17/19 0220 10/18/19 0227 10/19/19 0037 10/20/19 0420 10/21/19 0405  AST 67* 32 25 30 25   ALT 247* 170* 124* 132* 103*  ALKPHOS 74 78 71 72 71  BILITOT 1.0 0.5 1.0 1.4* 1.0  PROT 6.2* 5.5* 5.7* 5.1* 5.0*  ALBUMIN 2.8* 2.5* 2.7* 2.6* 2.5*   No results for input(s): LIPASE, AMYLASE in the last 168 hours. No results for input(s): AMMONIA in the last 168 hours. CBC: Recent Labs  Lab 10/17/19 0220 10/18/19 0227 10/19/19 0037 10/20/19 0420 10/21/19 0405  WBC 22.4* 20.8* 22.5* 19.4* 17.9*  NEUTROABS 20.8* 19.5* 20.8* 18.2* 14.7*  HGB 14.6 14.0 14.6 14.9 14.9  HCT 45.1 43.1 45.1 45.3 45.5  MCV 92.2 90.2 90.7 91.0 91.9  PLT 426* 411* 411* 396 371   Cardiac Enzymes: No results for input(s): CKTOTAL, CKMB, CKMBINDEX, TROPONINI in the last 168 hours. BNP: BNP (last 3 results) No results for input(s): BNP in the last 8760 hours.  ProBNP (last 3 results) No results for input(s): PROBNP in the last 8760 hours.  CBG: Recent Labs  Lab 10/20/19 1150 10/20/19 1508 10/20/19 2110 10/21/19 0722 10/21/19 1054  GLUCAP 133* 208* 139* 71 136*       Signed:  Dia Crawford, MD [facility] [phone] pager

## 2019-11-13 ENCOUNTER — Other Ambulatory Visit: Payer: Self-pay

## 2020-09-06 IMAGING — DX DG CHEST 1V PORT
1 series · 1 of 1 positions shown · non-contrast
Comparison: None.

CLINICAL DATA: Shortness of breath for 3 days. The patient was
diagnosed with [id number] 11 days ago.

EXAM:
PORTABLE CHEST 1 VIEW

[chest ap]
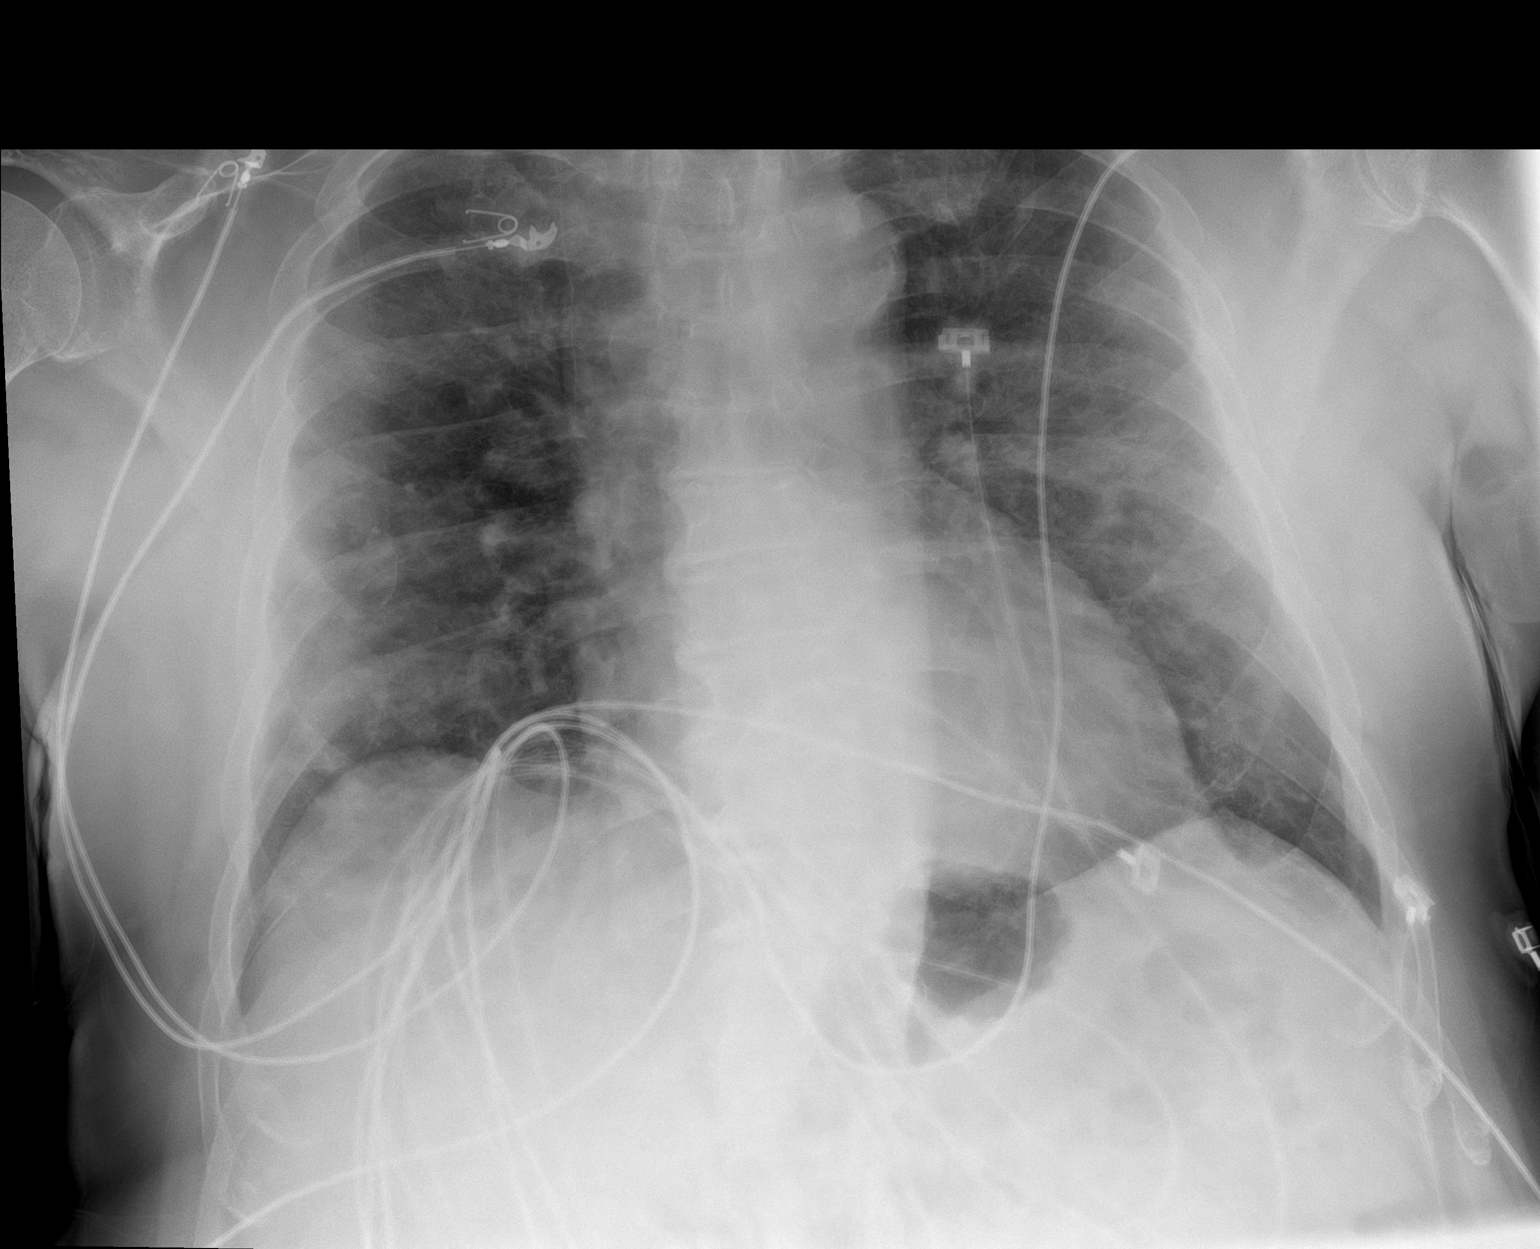

[1 of 1 positions shown; findings below may reference images not displayed]

FINDINGS: Patchy bilateral airspace disease is consistent with pneumonia.
Heart size is normal. Aortic atherosclerosis noted. No pneumothorax
or pleural effusion. No acute or focal bony abnormality.
IMPRESSION: Patchy bilateral airspace disease consistent with pneumonia.

## 2020-10-20 ENCOUNTER — Other Ambulatory Visit (HOSPITAL_COMMUNITY): Payer: Self-pay | Admitting: Family Medicine

## 2020-10-20 ENCOUNTER — Other Ambulatory Visit: Payer: Self-pay | Admitting: Family Medicine

## 2020-10-20 DIAGNOSIS — M545 Low back pain, unspecified: Secondary | ICD-10-CM

## 2020-11-03 ENCOUNTER — Other Ambulatory Visit: Payer: Self-pay

## 2020-11-03 ENCOUNTER — Ambulatory Visit (HOSPITAL_COMMUNITY)
Admission: RE | Admit: 2020-11-03 | Discharge: 2020-11-03 | Disposition: A | Discharge status: Home / Self Care | Payer: 59 | Source: Ambulatory Visit | Attending: Family Medicine | Admitting: Family Medicine

## 2020-11-03 DIAGNOSIS — M545 Low back pain, unspecified: Secondary | ICD-10-CM | POA: Insufficient documentation

## 2022-04-14 ENCOUNTER — Encounter: Payer: Self-pay | Admitting: *Deleted

## 2023-01-09 LAB — COLOGUARD: COLOGUARD: NEGATIVE

## 2024-07-23 DIAGNOSIS — R55 Syncope and collapse: Secondary | ICD-10-CM

## 2024-07-23 HISTORY — DX: Syncope and collapse: R55

## 2024-08-19 ENCOUNTER — Encounter (HOSPITAL_COMMUNITY): Payer: Self-pay

## 2024-08-19 ENCOUNTER — Emergency Department (HOSPITAL_COMMUNITY)
Admission: EM | Admit: 2024-08-19 | Discharge: 2024-08-19 | Disposition: A | Discharge status: Home / Self Care | Attending: Emergency Medicine | Admitting: Emergency Medicine

## 2024-08-19 ENCOUNTER — Emergency Department (HOSPITAL_COMMUNITY)

## 2024-08-19 ENCOUNTER — Other Ambulatory Visit: Payer: Self-pay

## 2024-08-19 DIAGNOSIS — Z794 Long term (current) use of insulin: Secondary | ICD-10-CM | POA: Insufficient documentation

## 2024-08-19 DIAGNOSIS — W1809XA Striking against other object with subsequent fall, initial encounter: Secondary | ICD-10-CM | POA: Insufficient documentation

## 2024-08-19 DIAGNOSIS — I10 Essential (primary) hypertension: Secondary | ICD-10-CM | POA: Diagnosis not present

## 2024-08-19 DIAGNOSIS — Z7984 Long term (current) use of oral hypoglycemic drugs: Secondary | ICD-10-CM | POA: Diagnosis not present

## 2024-08-19 DIAGNOSIS — R519 Headache, unspecified: Secondary | ICD-10-CM | POA: Diagnosis not present

## 2024-08-19 DIAGNOSIS — S199XXA Unspecified injury of neck, initial encounter: Secondary | ICD-10-CM | POA: Diagnosis present

## 2024-08-19 DIAGNOSIS — S14109A Unspecified injury at unspecified level of cervical spinal cord, initial encounter: Secondary | ICD-10-CM

## 2024-08-19 DIAGNOSIS — Z79899 Other long term (current) drug therapy: Secondary | ICD-10-CM | POA: Insufficient documentation

## 2024-08-19 DIAGNOSIS — E119 Type 2 diabetes mellitus without complications: Secondary | ICD-10-CM | POA: Insufficient documentation

## 2024-08-19 DIAGNOSIS — Z7982 Long term (current) use of aspirin: Secondary | ICD-10-CM | POA: Insufficient documentation

## 2024-08-19 DIAGNOSIS — R55 Syncope and collapse: Secondary | ICD-10-CM | POA: Diagnosis not present

## 2024-08-19 LAB — CBC WITH DIFFERENTIAL/PLATELET
Abs Immature Granulocytes: 0.05 K/uL (ref 0.00–0.07)
Basophils Absolute: 0 K/uL (ref 0.0–0.1)
Basophils Relative: 0 %
Eosinophils Absolute: 0.1 K/uL (ref 0.0–0.5)
Eosinophils Relative: 0 %
HCT: 46.3 % (ref 39.0–52.0)
Hemoglobin: 15.5 g/dL (ref 13.0–17.0)
Immature Granulocytes: 0 %
Lymphocytes Relative: 15 %
Lymphs Abs: 1.8 K/uL (ref 0.7–4.0)
MCH: 29.9 pg (ref 26.0–34.0)
MCHC: 33.5 g/dL (ref 30.0–36.0)
MCV: 89.4 fL (ref 80.0–100.0)
Monocytes Absolute: 0.6 K/uL (ref 0.1–1.0)
Monocytes Relative: 5 %
Neutro Abs: 10 K/uL — ABNORMAL HIGH (ref 1.7–7.7)
Neutrophils Relative %: 80 %
Platelets: 224 K/uL (ref 150–400)
RBC: 5.18 MIL/uL (ref 4.22–5.81)
RDW: 13.8 % (ref 11.5–15.5)
WBC: 12.6 K/uL — ABNORMAL HIGH (ref 4.0–10.5)
nRBC: 0 % (ref 0.0–0.2)

## 2024-08-19 LAB — TROPONIN T, HIGH SENSITIVITY
Troponin T High Sensitivity: <15 ng/L (ref 0–19)
Troponin T High Sensitivity: <15 ng/L (ref 0–19)

## 2024-08-19 LAB — URINALYSIS, ROUTINE W REFLEX MICROSCOPIC
Bilirubin Urine: NEGATIVE
Glucose, UA: NEGATIVE mg/dL
Hgb urine dipstick: NEGATIVE
Ketones, ur: 5 mg/dL — AB
Leukocytes,Ua: NEGATIVE
Nitrite: NEGATIVE
Protein, ur: NEGATIVE mg/dL
Specific Gravity, Urine: 1.012 (ref 1.005–1.030)
pH: 6 (ref 5.0–8.0)

## 2024-08-19 LAB — COMPREHENSIVE METABOLIC PANEL WITH GFR
ALT: 20 U/L (ref 0–44)
AST: 25 U/L (ref 15–41)
Albumin: 4.5 g/dL (ref 3.5–5.0)
Alkaline Phosphatase: 89 U/L (ref 38–126)
Anion gap: 12 (ref 5–15)
BUN: 12 mg/dL (ref 8–23)
CO2: 28 mmol/L (ref 22–32)
Calcium: 9.1 mg/dL (ref 8.9–10.3)
Chloride: 102 mmol/L (ref 98–111)
Creatinine, Ser: 0.75 mg/dL (ref 0.61–1.24)
GFR, Estimated: >60 mL/min (ref >60)
Glucose, Bld: 157 mg/dL — ABNORMAL HIGH (ref 70–99)
Potassium: 3.6 mmol/L (ref 3.5–5.1)
Sodium: 142 mmol/L (ref 135–145)
Total Bilirubin: 0.6 mg/dL (ref 0.0–1.2)
Total Protein: 7.2 g/dL (ref 6.5–8.1)

## 2024-08-19 LAB — MAGNESIUM: Magnesium: 2.1 mg/dL (ref 1.7–2.4)

## 2024-08-19 LAB — CBG MONITORING, ED: Glucose-Capillary: 151 mg/dL — ABNORMAL HIGH (ref 70–99)

## 2024-08-19 MED ORDER — GADOBUTROL 1 MMOL/ML IV SOLN
10.0000 mL | Freq: Once | INTRAVENOUS | Status: AC | PRN
Start: 2024-08-19 — End: 2024-08-19
  Administered 2024-08-19: 10 mL via INTRAVENOUS

## 2024-08-19 MED ORDER — LACTATED RINGERS IV BOLUS
1000.0000 mL | Freq: Once | INTRAVENOUS | Status: AC
  Administered 2024-08-19: 1000 mL via INTRAVENOUS

## 2024-08-19 MED ORDER — OXYCODONE-ACETAMINOPHEN 5-325 MG PO TABS: 1.0000 | ORAL_TABLET | Freq: Three times a day (TID) | ORAL | 0 refills | Status: AC | PRN

## 2024-08-19 NOTE — ED Notes (Signed)
 Patient transported to CT

## 2024-08-19 NOTE — ED Provider Notes (Signed)
 [place] EMERGENCY DEPARTMENT AT [facility] Provider Note   CSN: [id number] Arrival date & time: 08/19/24  9177     Patient presents with: Eduardo Dunn is a 68 y.o. male.   HPI Patient presents for syncope and fall.  Medical history includes HTN, HLD, DM.  Yesterday, he was in his normal state of health.  He stayed up until 3 AM watching a baseball game.  He subsequently woke up at around 6:30 AM to use the bathroom.  As he was using the bathroom, he had onset of dizziness, lightheadedness, nausea.  He had a syncopal episode and fell backwards.  He did strike the back of his head.  He believes that his LOC was only 1 or 2 seconds.  He did hear himself at the ground.  His wife came to his side and helped him back to the bed.  This morning, he sat in a recliner.  While in the recliner, he had chills, generalized weakness, and tremors.  Currently, he is asymptomatic other than some pain and soreness in the back of his head and neck.  Patient states that he has had episodes of vasovagal syncope in the past.  This seemed to improved after he switched his amlodipine to lisinopril 3 months ago.    Prior to Admission medications   Medication Sig Start Date End Date Taking? Authorizing Provider  oxyCODONE-acetaminophen  (PERCOCET/ROXICET) 5-325 MG tablet Take 1 tablet by mouth every 8 (eight) hours as needed for up to 3 days for severe pain (pain score 7-10). 08/19/24 08/22/24 Yes Melvenia Motto, MD  aspirin  EC 81 MG tablet Take 1 tablet (81 mg total) by mouth daily as needed. 10/21/19   Milissa Tod PARAS, MD  Insulin  Detemir (LEVEMIR ) 100 UNIT/ML Pen Inject 8 Units into the skin 2 (two) times daily. 10/21/19   Milissa Tod PARAS, MD  Insulin  Pen Needle (PEN NEEDLES 29GX1/2) 29G X MISC Use as directed 10/21/19   Milissa Tod PARAS, MD  linagliptin  (TRADJENTA ) 5 MG TABS tablet Take 1 tablet (5 mg total) by mouth daily. 10/22/19   Milissa Tod PARAS, MD  metFORMIN  (GLUCOPHAGE ) 500 MG  tablet Take 1 tablet (500 mg total) by mouth 2 (two) times daily with a meal. 10/21/19   Milissa Tod PARAS, MD  ondansetron  (ZOFRAN ) 4 MG tablet Take 1 tablet (4 mg total) by mouth every 6 (six) hours as needed for nausea. 10/21/19   Milissa Tod PARAS, MD  pantoprazole  (PROTONIX ) 40 MG tablet Take 1 tablet (40 mg total) by mouth daily. 10/22/19   Milissa Tod PARAS, MD  polyethylene glycol (MIRALAX  / GLYCOLAX ) 17 g packet Take 17 g by mouth daily as needed for moderate constipation. 10/21/19   Milissa Tod PARAS, MD  predniSONE  (DELTASONE ) 20 MG tablet Take 3 tablets (60 mg total) by mouth daily with breakfast. 10/22/19   Milissa Tod PARAS, MD  simvastatin  (ZOCOR ) 40 MG tablet Take 1 tablet (40 mg total) by mouth every evening. 10/21/19   Milissa Tod PARAS, MD  traMADol  (ULTRAM ) 50 MG tablet Take 1 tablet (50 mg total) by mouth every 6 (six) hours as needed for moderate pain. 10/21/19   Milissa Tod PARAS, MD    Allergies: Patient has no known allergies.    Review of Systems  Constitutional:  Positive for chills.  Musculoskeletal:  Positive for neck pain.  Neurological:  Positive for tremors, syncope, weakness (Generalized) and headaches.  All other systems reviewed and are negative.  Updated Vital Signs BP 129/80   Pulse 67   Temp 98.1 F (36.7 C)   Resp 14   Ht 5' 7 (1.702 m)   Wt 104.3 kg   SpO2 96%   BMI 36.02 kg/m   Physical Exam Vitals and nursing note reviewed.  Constitutional:      General: He is not in acute distress.    Appearance: Normal appearance. He is well-developed. He is not ill-appearing, toxic-appearing or diaphoretic.  HENT:     Head: Normocephalic and atraumatic.     Right Ear: External ear normal.     Left Ear: External ear normal.     Nose: Nose normal.     Mouth/Throat:     Mouth: Mucous membranes are moist.  Eyes:     Extraocular Movements: Extraocular movements intact.     Conjunctiva/sclera: Conjunctivae normal.  Cardiovascular:     Rate and Rhythm: Normal  rate and regular rhythm.  Pulmonary:     Effort: Pulmonary effort is normal. No respiratory distress.     Breath sounds: No wheezing or rales.  Abdominal:     General: There is no distension.     Palpations: Abdomen is soft.     Tenderness: There is no abdominal tenderness.  Musculoskeletal:        General: No swelling. Normal range of motion.     Cervical back: Normal range of motion and neck supple.  Skin:    General: Skin is warm and dry.     Coloration: Skin is not jaundiced or pale.  Neurological:     General: No focal deficit present.     Mental Status: He is alert and oriented to person, place, and time.     Cranial Nerves: No cranial nerve deficit.     Sensory: No sensory deficit.     Motor: No weakness.     Coordination: Coordination normal.  Psychiatric:        Mood and Affect: Mood normal.        Behavior: Behavior normal.     (all labs ordered are listed, but only abnormal results are displayed) Labs Reviewed  CBC WITH DIFFERENTIAL/PLATELET - Abnormal; Notable for the following components:      Result Value   WBC 12.6 (*)    Neutro Abs 10.0 (*)    All other components within normal limits  COMPREHENSIVE METABOLIC PANEL WITH GFR - Abnormal; Notable for the following components:   Glucose, Bld 157 (*)    All other components within normal limits  URINALYSIS, ROUTINE W REFLEX MICROSCOPIC - Abnormal; Notable for the following components:   Ketones, ur 5 (*)    All other components within normal limits  CBG MONITORING, ED - Abnormal; Notable for the following components:   Glucose-Capillary 151 (*)    All other components within normal limits  MAGNESIUM  TROPONIN T, HIGH SENSITIVITY  TROPONIN T, HIGH SENSITIVITY    EKG: EKG Interpretation Date/Time:  Tuesday August 19 2024 08:57:47 EDT Ventricular Rate:  70 PR Interval:  162 QRS Duration:  96 QT Interval:  411 QTC Calculation: 444 R Axis:   32  Text Interpretation: Sinus rhythm Borderline  repolarization abnormality Confirmed by Melvenia Motto [phone]) on 08/19/2024 1:12:19 PM  Radiology: MR Brain W and Wo Contrast Result Date: 08/19/2024 EXAM: MRI BRAIN WITH AND WITHOUT CONTRAST 08/19/2024 12:54:41 PM TECHNIQUE: Multiplanar multisequence MRI of the head/brain was performed with and without the administration of 10 mL gadobutrol (GADAVIST) 1 MMOL/ML injection. COMPARISON: CT of  the head dated 08/19/2024. CLINICAL HISTORY: Headache, increasing frequency or severity. FINDINGS: BRAIN AND VENTRICLES: No acute infarct. No acute intracranial hemorrhage. No mass effect or midline shift. No hydrocephalus. There is age-related atrophy. There are a few punctate foci of increased T2 signal within the cerebral white matter bilaterally. The sella is unremarkable. Normal flow voids. No mass or abnormal enhancement. ORBITS: No acute abnormality. SINUSES: No acute abnormality. BONES AND SOFT TISSUES: Normal bone marrow signal and enhancement. There is a circumscribed lesion again demonstrated within the calvaria at the level of the coronal suture. It appears to be of fluid density and demonstrates no appreciable enhancement. There is also no dural or periosteal reaction. No acute soft tissue abnormality. IMPRESSION: 1. No acute intracranial abnormality. 2. Few punctate foci of increased T2 signal within the cerebral white matter bilaterally. 3. Circumscribed lesion within the left calvaria at the level of the coronal suture, appearing to be of fluid density with no appreciable enhancement or dural/periosteal reaction. The lesion is thought to be highly likely benign, but lytic metastatic disease or plasmacytoma cannot be excluded with certainty. Electronically signed by: Evalene Coho MD 08/19/2024 01:13 PM EDT RP Workstation: [id number]   CT HEAD WO CONTRAST Result Date: 08/19/2024 EXAM: CT HEAD WITHOUT CONTRAST 08/19/2024 10:11:10 AM TECHNIQUE: CT of the head was performed without the administration of  intravenous contrast. Automated exposure control, iterative reconstruction, and/or weight based adjustment of the mA/kV was utilized to reduce the radiation dose to as low as reasonably achievable. COMPARISON: None available. CLINICAL HISTORY: Head trauma, moderate-severe. Pt fell this am at home going to the bathroom. States he stayed up late watching the ball game then just got dizzy this am. Endorses hitting back of head on counter with no bleeding or hematomas at this time. States his sugar was 155 this am. FINDINGS: BRAIN AND VENTRICLES: No acute hemorrhage. No evidence of acute infarct. No hydrocephalus. No extra-axial collection. No mass effect or midline shift. ORBITS: No acute abnormality. SINUSES: No acute abnormality. SOFT TISSUES AND SKULL: No acute soft tissue abnormality. There is a 1.9 x 0.7 cm lytic lesion within the left frontal calvarium with cortical breakthrough. IMPRESSION: 1. No acute intracranial abnormality. 2. 1.9 x 0.7 cm lytic lesion within left frontal calvarium with cortical breakthrough. While a benign lesion is favored, lytic metastatic disease and plasmacytoma are considerations and clinical correlation and follow up are recommended. Electronically signed by: Evalene Coho MD 08/19/2024 10:25 AM EDT RP Workstation: [id number]   CT CERVICAL SPINE WO CONTRAST Result Date: 08/19/2024 CLINICAL DATA:  Neck trauma. Fall this morning going to the bathroom. Neck pain. EXAM: CT CERVICAL SPINE WITHOUT CONTRAST TECHNIQUE: Multidetector CT imaging of the cervical spine was performed without intravenous contrast. Multiplanar CT image reconstructions were also generated. RADIATION DOSE REDUCTION: This exam was performed according to the departmental dose-optimization program which includes automated exposure control, adjustment of the mA and/or kV according to patient size and/or use of iterative reconstruction technique. COMPARISON:  None Available. FINDINGS: Alignment: Straightening with  a mild convex left scoliosis. No focal angulation or listhesis. Skull base and vertebrae: Status post C6-7 ACDF with an anterior plate and screws. The hardware is intact without loosening. There is solid interbody and interfacetal ankylosis bilaterally. There is a subtle nondisplaced fracture involving the right C7 articulating facet, best seen on the sagittal images. No other acute osseous findings are demonstrated. There is no evidence unstable fracture. Soft tissues and spinal canal: No prevertebral fluid or swelling. No visible canal hematoma.  Disc levels: As above, previous C6-7 ACDF with solid ankylosis. Multilevel spondylosis at the additional levels with uncinate spurring and endplate osteophytes, most prominent anteriorly at C4-5 and C5-6. Small central disc protrusions are present at each unfused level. No resulting high-grade spinal stenosis or high-grade foraminal narrowing demonstrated. There is asymmetric left-sided facet hypertrophy at C3-4 and C4-5. Upper chest: Clear lung apices. Other: Bilateral carotid atherosclerosis. IMPRESSION: 1. Subtle nondisplaced fracture involving the right C7 articulating facet. No evidence of unstable fracture or traumatic subluxation. 2. No other acute osseous findings. 3. 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 4. Multilevel cervical spondylosis as described. Electronically Signed   By: Elsie Perone M.D.   On: 08/19/2024 10:25   DG Chest Port 1 View Result Date: 08/19/2024 EXAM: 1 VIEW(S) XRAY OF THE CHEST 08/19/2024 09:20:00 AM COMPARISON: 10/13/2019 CLINICAL HISTORY: Pt c/o dizziness, weakness s/p syncope this AM. FINDINGS: LUNGS AND PLEURA: No focal pulmonary opacity. No pulmonary edema. No pleural effusion. No pneumothorax. HEART AND MEDIASTINUM: No acute abnormality of the cardiac and mediastinal silhouettes. BONES AND SOFT TISSUES: No acute osseous abnormality. IMPRESSION: 1. No acute cardiopulmonary abnormality. Electronically signed by: Lynwood Seip  MD 08/19/2024 09:43 AM EDT RP Workstation: [id number]     Procedures   Medications Ordered in the ED  lactated ringers bolus 1,000 mL (1,000 mLs Intravenous New Bag/Given 08/19/24 1126)  gadobutrol (GADAVIST) 1 MMOL/ML injection 10 mL (10 mLs Intravenous Contrast Given 08/19/24 1225)                                    Medical Decision Making Amount and/or Complexity of Data Reviewed Labs: ordered. Radiology: ordered.  Risk Prescription drug management.   This patient presents to the ED for concern of syncope and fall, this involves an extensive number of treatment options, and is a complaint that carries with it a high risk of complications and morbidity.  The differential diagnosis includes vasovagal episode, arrhythmia, dehydration, anemia, metabolic derangements, acute injuries   Co morbidities / Chronic conditions that complicate the patient evaluation   HTN, HLD, DM   Additional history obtained:  Additional history obtained from EMR External records from outside source obtained and reviewed including patient's family   Lab Tests:  I Ordered, and personally interpreted labs.  The pertinent results include: Mild leukocytosis, normal kidney function, normal electrolytes, normal hemoglobin normal troponin   Imaging Studies ordered:  I ordered imaging studies including chest x-ray, CT of head and cervical spine, MRI brain I independently visualized and interpreted imaging which showed CT of cervical spine showed a subtle nondisplaced fracture involving right C7 articulating facets; MRI showed circumscribed lesion within the left calvaria, likely benign I agree with the radiologist interpretation   Cardiac Monitoring: / EKG:  The patient was maintained on a cardiac monitor.  I personally viewed and interpreted the cardiac monitored which showed an underlying rhythm of: Sinus rhythm   Problem List / ED Course / Critical interventions / Medication  management  Patient presenting after syncope and fall.  This occurred earlier this morning.  He did have a prodrome of dizziness, lightheadedness, and nausea.  His LOC was 1 to 2 seconds.  Following his episode, he did have transient chills, tremors, and generalized weakness.  He is now asymptomatic other than some soreness in the back of his head and neck.  On arrival in the ED, he is well-appearing.  Vital signs are reassuring.  He has no focal neurologic deficits.  He declines any pain medication.  Patient was placed on monitor and workup was initiated.  Imaging studies showed a subtle nondisplaced fracture of C7 articulating facets.  Patient was placed in cervical collar.  He did undergo ACDF over 20 years ago with Dr. Mavis.  The CT of his head showed a cystic lesion in the left calvarium.  MRI was ordered to further evaluate.  MRI showed redemonstration of this with findings considered likely to be benign.  Patient to follow-up with Dr. Mavis for neck injury as well as calvarium lesion.  While in the ED, he maintained normal sinus rhythm.  When undergoing blood draw for second troponin, he had a drop in blood pressure with no arrhythmia.  This is consistent with vasovagal episode which was likely cause of his earlier syncope.  Patient advised to maintain good hydration.  He was discharged in stable condition.  Social Determinants of Health:  Lives at home with family     Final diagnoses:  Syncope and collapse  Acute traumatic injury of cervical spine [facility])    ED Discharge Orders          Ordered    oxyCODONE-acetaminophen  (PERCOCET/ROXICET) 5-325 MG tablet  Every 8 hours PRN        08/19/24 1345               Melvenia Motto, MD 08/19/24 1350

## 2024-08-19 NOTE — Discharge Instructions (Addendum)
 Your lab work is all reassuring.  Drink plenty of fluids at home to maintain good hydration.  The CT of your cervical spine showed the following: Subtle nondisplaced fracture involving the right C7 articulating facet  The MRI of your brain showed the following: Circumscribed lesion within the left calvaria at the level of the coronal suture, appearing to be of fluid density with no appreciable enhancement or dural/periosteal reaction. The lesion is thought to be highly likely benign, but lytic metastatic disease or plasmacytoma cannot be excluded with certainty  Follow-up with Dr. Mavis regarding both of these findings.  Continue to wear cervical collar until follow-up.  Take ibuprofen and Tylenol  as needed for pain.  A prescription for narcotic pain medication was sent to your pharmacy.  Take these only as needed.  Return to the emergency department for any new or worsening symptoms of concern.

## 2024-08-19 NOTE — ED Triage Notes (Signed)
 Pt fell this am at home going to the bathroom. States he stayed up late watching the ball game then just got dizzy this am. Endorses hitting back of head on counter with no bleeding or hematomas at this time. States his sugar was 155 this am.

## 2024-08-19 NOTE — ED Triage Notes (Signed)
 Pt refused C-collar per pt and EMS.

## 2024-09-22 DIAGNOSIS — I2510 Atherosclerotic heart disease of native coronary artery without angina pectoris: Secondary | ICD-10-CM

## 2024-09-22 HISTORY — DX: Atherosclerotic heart disease of native coronary artery without angina pectoris: I25.10

## 2024-11-12 ENCOUNTER — Encounter: Payer: Self-pay | Admitting: Cardiology

## 2024-11-12 ENCOUNTER — Ambulatory Visit: Admitting: Cardiology

## 2024-11-12 VITALS — BP 135/88 | HR 68 | Ht 64.0 in | Wt 232.4 lb

## 2024-11-12 DIAGNOSIS — I10 Essential (primary) hypertension: Secondary | ICD-10-CM

## 2024-11-12 DIAGNOSIS — E785 Hyperlipidemia, unspecified: Secondary | ICD-10-CM | POA: Diagnosis not present

## 2024-11-12 DIAGNOSIS — I2510 Atherosclerotic heart disease of native coronary artery without angina pectoris: Secondary | ICD-10-CM | POA: Diagnosis not present

## 2024-11-12 DIAGNOSIS — E1169 Type 2 diabetes mellitus with other specified complication: Secondary | ICD-10-CM

## 2024-11-12 DIAGNOSIS — E118 Type 2 diabetes mellitus with unspecified complications: Secondary | ICD-10-CM

## 2024-11-12 NOTE — Progress Notes (Unsigned)
 " Cardiology Office Note:  .   Date:  11/13/2024  ID:  Eduardo Dunn, DOB 1956-09-01, MRN [id number] PCP: Suanne Pfeiffer, NP  [facility] Providers Cardiologist:  Alm Clay, MD     Chief Complaint  Patient presents with   New Patient (Initial Visit)   Coronary Artery Disease    Coronary artery calcification seen on CT scan    Patient Profile: .     Eduardo Dunn is a ~ morbidly obese 69 y.o. male with a PMH notable for HTN, HLD, DM-2 who presents here for evaluation of Coronary Calcium Score a stent seen on CT scan at the request of Suanne Pfeiffer, NP.  He was referred by Pfeiffer Burkes, NP for evaluation of calcification found on a scan.  He was last seen by Ms. Burkes on September 22, 2024 after ER visit for syncope which occurred after standing up late watching the [employer]-went to bed at 3 AM and woke up at 6:30 AM to urinate and had a micturition syncope episode.  Cardiac MRI and CT scan were relatively unrevealing but there was evidence of a C7 vertebral fracture.  Following up with Dr. Mavis from neurosurgery CT scan showed dense coronary calcification on the coronary arteries and therefore is referred for cardiology consultation based on family history of CAD.    Subjective  Discussed the use of AI scribe software for clinical note transcription with the patient, who gave verbal consent to proceed.  History of Present Illness Eduardo Dunn is a 69 year old male who presents for evaluation of coronary artery calcification.  He has a history of coronary artery calcification identified on a CT scan. No chest pain, pressure, or tightness, and these symptoms do not occur with activity. Occasionally, he feels slight tightness but not routinely. No history of heart racing, skipping, or flip-flopping, and no shortness of breath, leg swelling, or irregular heartbeat.  He has a family history of heart disease, with his father, grandmother, and grandfather all having died  from heart attacks at relatively young ages. He has been proactive in managing his cholesterol and blood pressure due to his family history.  In October 2025, he experienced a vasovagal syncope episode after staying up late and waking up early, passing out after getting up to use the bathroom. He has had similar episodes in the past, attributed to vasovagal syncope. Since then, no further episodes of passing out, dizziness, or wooziness have occurred.  He has a history of high cholesterol, hypertension, and diabetes. He has been on statins for approximately 30 years, with well-controlled cholesterol levels. He takes simvastatin  40 mg, Zetia 10 mg, and aspirin  81 mg daily. Previously on amlodipine, he stopped due to side effects of flushing and faint feelings. Currently, he takes lisinopril 20 mg twice daily and hydrochlorothiazide 12.5 mg. He does not take metformin  due to stomach issues and is not on insulin . He takes Synthroid 50 mcg for thyroid management.  In December 2025, a CT scan revealed a lesion on his pancreas, but follow-up indicated no issues. An MRI on October 14, 2024, noted a mild pancreatic cyst and reported that the abdominal aorta appeared to be without aneurysm or abnormal caliber per the radiology report.  He has lost weight recently, dropping from 250 lbs to approximately 227 lbs, and is working on maintaining a healthy lifestyle. No significant muscle aches or pains that limit his activities.  Cardiovascular ROS: no chest pain or dyspnea on exertion positive for -  1 episode of syncope but none since then. negative for - edema, irregular heartbeat, orthopnea, palpitations, paroxysmal nocturnal dyspnea, rapid heart rate, shortness of breath, or further syncope or near-syncope, TIA/amaurosis fugax or claudication.    Objective   Past Medical History - Vasovagal syncope (micturition) - Type 2 diabetes mellitus - Hypertension - Hyperlipidemia - Coronary artery  calcification - Hypothyroidism  Surgical History: - Neck surgery with plate insertion (2022): Performed by Dr. Mavis  Social History - Tobacco: Never smoker - Was a race car driver for 40 years.  Family History - Father: Massive heart attack, deceased at age 24 due to Heart disease - Paternal grandmother: deceased at age 80 due to Heart disease - Paternal grandfather: Heart attack, deceased at age 68 due to Heart disease  Medications - Lisinopril 20 mg twice a day - Hydrochlorothiazide 12.5 mg half tab - Simvastatin  40 mg - Zetia 10 mg - Aspirin  81 mg - Metformin  500 mg twice a day - Levothyroxine 50 mcg in the morning - Protonix  40 mg - Baby aspirin  81 mg 1 every night - Flomax as needed  Studies Reviewed: SABRA   EKG Interpretation Date/Time:  Wednesday November 12 2024 11:02:39 EST Ventricular Rate:  69 PR Interval:  164 QRS Duration:  92 QT Interval:  420 QTC Calculation: 450 R Axis:   2  Text Interpretation: Sinus rhythm with occasional Premature ventricular complexes When compared with ECG of 19-Aug-2024 08:57, PREVIOUS ECG IS PRESENT Confirmed by Anner Lenis ([id number]) on 11/13/2024 9:04:32 PM     [facility] Related to Lipid Panel With LDL/HDL Ratio Component 09/22/24 01/14/24 05/02/23  Cholesterol, Total 151 176 139  Triglycerides 157 High  128 104  HDL 33 Low  35 Low  36 Low   VLDL Cholesterol Cal 28 23 19   LDL 90 118 High  84   Comprehensive Metabolic Panel Component 09/22/24 01/14/24  Glucose 94 123 High   BUN 13 9  Creatinine 0.80 0.83  eGFR 98 97  BUN/Creatinine Ratio 16 11  Sodium 143 143  Potassium 4.1 4.0  Chloride 103 103  CO2 22 23  CALCIUM 9.3 9.5  Total Protein 7.2 7.4  Albumin, Serum 4.8 4.9  Globulin, Total 2.4 2.5  Total Bilirubin 0.5 0.5  Alkaline Phosphatase 97 110  AST 22 23  ALT (SGPT) 22 26   POCT A1C Component 09/22/24 01/14/24 05/02/23  Hemoglobin A1c 6.4 Abnormal  6.7 Abnormal  6.8 Abnormal    Results Labs Lipid  panel (09/2024): LDL 90, total cholesterol 848 Hemoglobin A1c (09/2024): 6.4  Radiology MRI abdomen and pelvis (10/14/2024): 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   CT abdomen with IV contrast 09/29/2024: Heart/vessels: Normal cardiac size.    Dense coronary artery calcifications involving the Left Main, LAD, Diagonals, Obtuse Marginals, and RCA. Aortic valvular calcifications.  Mildly complex lipoma of the right chest wall.  Chronic diverticulosis with no diverticulitis.  Pancreatic low-attenuation lesion-recommend MRI.  L1, L4 and L5 compression fractures with L4 and L5 new compared to prior.   Risk Assessment/Calculations:           Physical Exam:   VS:  BP 135/88 (BP Location: Left Arm, Patient Position: Sitting, Cuff Size: Normal)   Pulse 68   Ht 5' 4 (1.626 m)   Wt 232 lb 6.4 oz (105.4 kg)   SpO2 97%   BMI 39.89 kg/m    Wt Readings from Last 3 Encounters:  11/12/24 232  lb 6.4 oz (105.4 kg)  08/19/24 230 lb (104.3 kg)  10/13/19 235 lb (106.6 kg)     GEN: Well nourished, well groomed; in no acute distress;; essentially morbidly obese. NECK: No JVD; No carotid bruits CARDIAC: Normal S1, S2; RRR, no murmurs, rubs, gallops RESPIRATORY:  Clear to auscultation without rales, wheezing or rhonchi ; nonlabored, good air movement. ABDOMEN: Soft, non-tender, non-distended EXTREMITIES:  No edema; No deformity      ASSESSMENT AND PLAN: .   Essential hypertension Blood pressure well-controlled. Diastolic slightly elevated.  With history of dizziness, would recommend caution with further lowering. - Continue lisinopril 20 mg daily and hydrochlorothiazide 12.5 mg daily.  Hyperlipidemia associated with type 2 diabetes mellitus (HCC) LDL 90, total cholesterol 848. On simvastatin  and Zetia. Goal to lower LDL further, potentially below 70, depending on calcium score. - Continue simvastatin  and Zetia. -  Reassess lipid management based on coronary calcium score results.  Type 2 diabetes mellitus A1c 6.4 indicates prediabetes/controlled DM.  Weight loss achieved. Not on insulin  or metformin  due to side effects. - Continue current diabetes management without insulin  or metformin . - Encouraged continued weight management and lifestyle modifications.  Coronary artery calcification seen on CAT scan Coronary calcification indicates plaque presence. No symptoms of ischemia or heart failure.  Extent and severity of calcification unknown. Proactive management to prevent cardiac events. Time spent discussing pathophysiology of coronary artery disease and the role of coronary calcium score for restratification.  Discussed the concept of negative and positive remodeling. - Ordered coronary calcium score to quantify calcification. - Continue aspirin  81 mg, simvastatin  40 mg, lisinopril 20 mg, and hydrochlorothiazide 25 mg. - Consider more aggressive cholesterol management if calcium score elevated. - Discuss potential stress test or coronary CT angiography if calcium score high.   Orders Placed This Encounter  Procedures   CT CARDIAC SCORING (SELF PAY ONLY)   EKG 12-Lead    No orders of the defined types were placed in this encounter.        Follow-Up: Return in about 3 months (around 02/10/2025) for Routine follow up with me, To discuss test results.  I spent 53 minutes in the care of Eduardo Dunn today including reviewing outside labs from Care Everywhere (2 minutes), reviewing outside studies (MRI and CT scan of the abdomen reviewed-6 minutes), face to face time discussing treatment options (25 minutes), reviewing records from ER visit in October along with CT scans, then evaluation in December with CT scans (via Care Everywhere-9 minutes), 11 minutes dictating, and documenting in the encounter.   Portions of this note were dictated using DRAGON voice recognition software. Please disregard any  errors in transcription. This record has been created using Conservation officer, historic buildings. Errors have been sought and corrected, but may not always be located. Such creation errors do not reflect on the standard of medical care.    Signed, Alm MICAEL Clay, MD, MS Alm Clay, M.D., M.S. Interventional Cardiologist  [facility] Pager # [phone]      "

## 2024-11-12 NOTE — Patient Instructions (Signed)
 Medication Instructions:   No changes *If you need a refill on your cardiac medications before your next appointment, please call your pharmacy*   Lab Work: Not needed .   Testing/Procedures:  CT coronary calcium score.  Your provider would like for you to have a Calcium Score CT. This test is painless. This is a non-contrast CT of the heat to look for calcified lesions in the coronary arteries. The cost of this is test cost $99 out of pocket and is not submitted to your insurance. The test can be performed at our [facility] Location in [place].  You can get the test scheduled in our office or you can call [phone].  Then press option 4, Then press option 2 Finally press option 2 and get your test scheduled.   Test locations:  [facility] at [facility]  [facility] at [facility]  This is $99 out of pocket.   Coronary CalciumScan A coronary calcium scan is an imaging test used to look for deposits of calcium and other fatty materials (plaques) in the inner lining of the blood vessels of the heart (coronary arteries). These deposits of calcium and plaques can partly clog and narrow the coronary arteries without producing any symptoms or warning signs. This puts a person at risk for a heart attack. This test can detect these deposits before symptoms develop. Tell a health care provider about: Any allergies you have. All medicines you are taking, including vitamins, herbs, eye drops, creams, and over-the-counter medicines. Any problems you or family members have had with anesthetic medicines. Any blood disorders you have. Any surgeries you have had. Any medical conditions you have. Whether you are pregnant or may be pregnant. What are the risks? Generally, this is a safe procedure. However, problems may occur, including: Harm to a pregnant woman and her  unborn baby. This test involves the use of radiation. Radiation exposure can be dangerous to a pregnant woman and her unborn baby. If you are pregnant, you generally should not have this procedure done. Slight increase in the risk of cancer. This is because of the radiation involved in the test. What happens before the procedure? No preparation is needed for this procedure. What happens during the procedure? You will undress and remove any jewelry around your neck or chest. You will put on a hospital gown. Sticky electrodes will be placed on your chest. The electrodes will be connected to an electrocardiogram (ECG) machine to record a tracing of the electrical activity of your heart. A CT scanner will take pictures of your heart. During this time, you will be asked to lie still and hold your breath for 2-3 seconds while a picture of your heart is being taken. The procedure may vary among health care providers and hospitals. What happens after the procedure? You can get dressed. You can return to your normal activities. It is up to you to get the results of your test. Ask your health care provider, or the department that is doing the test, when your results will be ready. Summary A coronary calcium scan is an imaging test used to look for deposits of calcium and other fatty materials (plaques) in the inner lining of the blood vessels of the heart (coronary arteries). Generally, this is a safe procedure. Tell your health care provider if you are pregnant or may be pregnant. No preparation is needed for this procedure. A CT  scanner will take pictures of your heart. You can return to your normal activities after the scan is done. This information is not intended to replace advice given to you by your health care provider. Make sure you discuss any questions you have with your health care provider. Document Released: 04/06/2008 Document Revised: 08/28/2016 Document Reviewed: 08/28/2016 Elsevier  Interactive Patient Education  2017 [employer].   Follow-Up: At [facility], you and your health needs are our priority.  As part of our continuing mission to provide you with exceptional heart care, we have created designated Provider Care Teams.  These Care Teams include your primary Cardiologist (physician) and Advanced Practice Providers (APPs -  Physician Assistants and Nurse Practitioners) who all work together to provide you with the care you need, when you need it.     Your next appointment:   3 month(s)  The format for your next appointment:   In Person  Provider:   Alm Clay, MD

## 2024-11-13 ENCOUNTER — Encounter: Payer: Self-pay | Admitting: Cardiology

## 2024-11-13 NOTE — Assessment & Plan Note (Signed)
 LDL 90, total cholesterol 848. On simvastatin  and Zetia. Goal to lower LDL further, potentially below 70, depending on calcium score. - Continue simvastatin  and Zetia. - Reassess lipid management based on coronary calcium score results.  Type 2 diabetes mellitus A1c 6.4 indicates prediabetes/controlled DM.  Weight loss achieved. Not on insulin  or metformin  due to side effects. - Continue current diabetes management without insulin  or metformin . - Encouraged continued weight management and lifestyle modifications.

## 2024-11-13 NOTE — Assessment & Plan Note (Signed)
 Blood pressure well-controlled. Diastolic slightly elevated.  With history of dizziness, would recommend caution with further lowering. - Continue lisinopril 20 mg daily and hydrochlorothiazide 12.5 mg daily.

## 2024-11-13 NOTE — Assessment & Plan Note (Signed)
 Coronary calcification indicates plaque presence. No symptoms of ischemia or heart failure.  Extent and severity of calcification unknown. Proactive management to prevent cardiac events. Time spent discussing pathophysiology of coronary artery disease and the role of coronary calcium score for restratification.  Discussed the concept of negative and positive remodeling. - Ordered coronary calcium score to quantify calcification. - Continue aspirin  81 mg, simvastatin  40 mg, lisinopril 20 mg, and hydrochlorothiazide 25 mg. - Consider more aggressive cholesterol management if calcium score elevated. - Discuss potential stress test or coronary CT angiography if calcium score high.

## 2024-11-19 ENCOUNTER — Ambulatory Visit (HOSPITAL_COMMUNITY)
Admission: RE | Admit: 2024-11-19 | Discharge: 2024-11-19 | Disposition: A | Discharge status: Home / Self Care | Payer: Self-pay | Source: Ambulatory Visit | Attending: Cardiovascular Disease | Admitting: Cardiovascular Disease

## 2024-11-19 DIAGNOSIS — I10 Essential (primary) hypertension: Secondary | ICD-10-CM | POA: Insufficient documentation

## 2024-11-19 DIAGNOSIS — I2510 Atherosclerotic heart disease of native coronary artery without angina pectoris: Secondary | ICD-10-CM | POA: Insufficient documentation

## 2024-11-20 ENCOUNTER — Ambulatory Visit: Payer: Self-pay | Admitting: Cardiology

## 2024-11-20 NOTE — Progress Notes (Signed)
 Significantly elevated at Coronary Calcium Score of 4429: LAD 1903, RCA 20-90, LCx 236.  This is a 99 percentile value and does warrant further evaluation.  Need to be much more aggressive with Cholesterol management.  Will probably need to convert from simvastatin  to a different statin.  However with the CT scan there is high, I think we may need to consider doing something like a Cardiac Stress PET (stress test).  Will try to contact him via telephone to discuss stress PET.  Alm Clay, MD

## 2024-11-25 ENCOUNTER — Telehealth: Payer: Self-pay

## 2024-11-25 ENCOUNTER — Other Ambulatory Visit: Payer: Self-pay

## 2024-11-25 DIAGNOSIS — R931 Abnormal findings on diagnostic imaging of heart and coronary circulation: Secondary | ICD-10-CM

## 2024-11-28 ENCOUNTER — Encounter (HOSPITAL_COMMUNITY): Payer: Self-pay

## 2024-11-28 ENCOUNTER — Other Ambulatory Visit (HOSPITAL_COMMUNITY): Payer: Self-pay | Admitting: *Deleted

## 2024-12-02 ENCOUNTER — Other Ambulatory Visit (HOSPITAL_COMMUNITY)

## 2025-02-10 ENCOUNTER — Ambulatory Visit: Admitting: Cardiology
